# Patient Record
Sex: Female | Born: 1950
Health system: Southern US, Community
[De-identification: ages and names within clinical notes are randomized; demographics above are authoritative.]

## PROBLEM LIST (undated history)

## (undated) DIAGNOSIS — H44001 Unspecified purulent endophthalmitis, right eye: Secondary | ICD-10-CM

## (undated) DIAGNOSIS — R42 Dizziness and giddiness: Secondary | ICD-10-CM

## (undated) DIAGNOSIS — E119 Type 2 diabetes mellitus without complications: Secondary | ICD-10-CM

## (undated) DIAGNOSIS — E785 Hyperlipidemia, unspecified: Secondary | ICD-10-CM

## (undated) HISTORY — DX: Unspecified purulent endophthalmitis, right eye: H44.001

## (undated) HISTORY — PX: ECTOPIC PREGNANCY SURGERY: SHX613

## (undated) HISTORY — PX: BREAST BIOPSY: SHX20

---

## 2004-09-26 ENCOUNTER — Inpatient Hospital Stay: Payer: Self-pay | Admitting: Specialist

## 2006-07-08 HISTORY — PX: OTHER SURGICAL HISTORY: SHX169

## 2006-09-04 ENCOUNTER — Ambulatory Visit: Payer: Self-pay | Admitting: Family Medicine

## 2007-11-19 ENCOUNTER — Ambulatory Visit: Payer: Self-pay | Admitting: Family Medicine

## 2010-09-25 ENCOUNTER — Ambulatory Visit: Payer: Self-pay | Admitting: Family Medicine

## 2013-05-05 ENCOUNTER — Ambulatory Visit: Payer: Self-pay | Admitting: Obstetrics and Gynecology

## 2016-02-07 ENCOUNTER — Encounter: Payer: Self-pay | Admitting: Obstetrics and Gynecology

## 2016-02-07 ENCOUNTER — Ambulatory Visit (INDEPENDENT_AMBULATORY_CARE_PROVIDER_SITE_OTHER): Payer: BLUE CROSS/BLUE SHIELD | Admitting: Obstetrics and Gynecology

## 2016-02-07 VITALS — BP 100/60 | HR 75 | Ht 63.0 in | Wt 182.5 lb

## 2016-02-07 DIAGNOSIS — Z13220 Encounter for screening for lipoid disorders: Secondary | ICD-10-CM | POA: Diagnosis not present

## 2016-02-07 DIAGNOSIS — Z131 Encounter for screening for diabetes mellitus: Secondary | ICD-10-CM

## 2016-02-07 DIAGNOSIS — Z124 Encounter for screening for malignant neoplasm of cervix: Secondary | ICD-10-CM | POA: Diagnosis not present

## 2016-02-07 DIAGNOSIS — Z1239 Encounter for other screening for malignant neoplasm of breast: Secondary | ICD-10-CM | POA: Diagnosis not present

## 2016-02-07 DIAGNOSIS — Z1211 Encounter for screening for malignant neoplasm of colon: Secondary | ICD-10-CM

## 2016-02-07 DIAGNOSIS — Z13820 Encounter for screening for osteoporosis: Secondary | ICD-10-CM | POA: Diagnosis not present

## 2016-02-07 DIAGNOSIS — Z01419 Encounter for gynecological examination (general) (routine) without abnormal findings: Secondary | ICD-10-CM | POA: Diagnosis not present

## 2016-02-07 NOTE — Progress Notes (Signed)
ANNUAL PREVENTATIVE CARE GYNECOLOGY  ENCOUNTER NOTE  Subjective:       Wendy Tucker is a 65 y.o. G1P0010 single post-menopausal female here for a routine annual gynecologic exam. The patient is sexually active. The patient is not taking hormone replacement therapy. Patient denies post-menopausal vaginal bleeding. The patient wears seatbelts: yes. The patient participates in regular exercise: no. Has the patient ever been transfused or tattooed?: no. The patient reports that there is not domestic violence in her life.  Current complaints: 1.  None   Gynecologic History Patient's last menstrual period was 02/07/1991.  Patient is post-menopausal.  Contraception: post menopausal status Last Pap: "several years ago".  Results were: normal Last mammogram: 04/2013. Results were: normal Last Colonoscopy: has never had a colonscopy   Obstetric History OB History  Gravida Para Term Preterm AB Living  1       1    SAB TAB Ectopic Multiple Live Births      1        # Outcome Date GA Lbr Len/2nd Weight Sex Delivery Anes PTL Lv  1 Ectopic               History reviewed. No pertinent past medical history.  Family History  Problem Relation Age of Onset  . Hypertension Mother   . Stomach cancer Father   . Breast cancer Sister     Past Surgical History:  Procedure Laterality Date  . ECTOPIC PREGNANCY SURGERY      Social History   Social History  . Marital status: Single    Spouse name: N/A  . Number of children: N/A  . Years of education: N/A   Occupational History  . Not on file.   Social History Main Topics  . Smoking status: Never Smoker  . Smokeless tobacco: Never Used  . Alcohol use Yes     Comment: Occasionally   . Drug use: No  . Sexual activity: Yes    Birth control/ protection: Post-menopausal   Other Topics Concern  . Not on file   Social History Narrative  . No narrative on file    No current outpatient prescriptions on file prior to visit.   No  current facility-administered medications on file prior to visit.     No Known Allergies   Review of Systems ROS Review of Systems - General ROS: negative for - chills, fatigue, fever, hot flashes, night sweats, weight gain or weight loss Psychological ROS: negative for - anxiety, decreased libido, depression, mood swings, physical abuse or sexual abuse Ophthalmic ROS: negative for - blurry vision, eye pain or loss of vision ENT ROS: negative for - headaches, hearing change, visual changes or vocal changes Allergy and Immunology ROS: negative for - hives, itchy/watery eyes or seasonal allergies Hematological and Lymphatic ROS: negative for - bleeding problems, bruising, swollen lymph nodes or weight loss Endocrine ROS: negative for - galactorrhea, hair pattern changes, hot flashes, malaise/lethargy, mood swings, palpitations, polydipsia/polyuria, skin changes, temperature intolerance or unexpected weight changes Breast ROS: negative for - new or changing breast lumps or nipple discharge Respiratory ROS: negative for - cough or shortness of breath Cardiovascular ROS: negative for - chest pain, irregular heartbeat, palpitations or shortness of breath Gastrointestinal ROS: no abdominal pain, change in bowel habits, or black or bloody stools Genito-Urinary ROS: no dysuria, trouble voiding, or hematuria Musculoskeletal ROS: negative for - joint pain or joint stiffness Neurological ROS: negative for - bowel and bladder control changes Dermatological ROS: negative for  rash and skin lesion changes   Objective:   BP 100/60 (BP Location: Right Arm, Patient Position: Sitting, Cuff Size: Large)   Pulse 75   Ht 5\' 3"  (1.6 m)   Wt 182 lb 8 oz (82.8 kg)   LMP 02/07/1991   BMI 32.33 kg/m  CONSTITUTIONAL: Well-developed, well-nourished female in no acute distress.  PSYCHIATRIC: Normal mood and affect. Normal behavior. Normal judgment and thought content. National: Alert and oriented to person,  place, and time. Normal muscle tone coordination. No cranial nerve deficit noted. HENT:  Normocephalic, atraumatic, External right and left ear normal. Oropharynx is clear and moist EYES: Conjunctivae and EOM are normal. Pupils are equal, round, and reactive to light. No scleral icterus.  NECK: Normal range of motion, supple, no masses.  Normal thyroid.  SKIN: Skin is warm and dry. No rash noted. Not diaphoretic. No erythema. No pallor. CARDIOVASCULAR: Normal heart rate noted, regular rhythm, no murmur. RESPIRATORY: Clear to auscultation bilaterally. Effort and breath sounds normal, no problems with respiration noted. BREASTS: Symmetric in size. No masses, skin changes, nipple drainage, or lymphadenopathy. ABDOMEN: Soft, normal bowel sounds, no distention noted.  No tenderness, rebound or guarding.  BLADDER: Normal PELVIC: external genitalia normal, rectovaginal septum normal.  Vagina without discharge, mild  Atrophy present.  Cervix normal appearing, no lesions and no motion tenderness.  Uterus mobile, nontender, normal shape and size.  Adnexae non-palpable, nontender bilaterally.  RECTAL: External Exam NormaI, No Rectal Masses and Normal Sphincter tone  MUSCULOSKELETAL: Normal range of motion. No tenderness.  No cyanosis, clubbing, or edema.  2+ distal pulses. LYMPHATIC: No Axillary, Supraclavicular, or Inguinal Adenopathy.   Labs:  The patient has not had any recent blood work.   Assessment:   Annual gynecologic examination in postmenopausal female Contraception: post menopausal status Obesity (Class I)  Plan:   Pap smear performed today.  Mammogram: Ordered Stool Guaiac Testing:  Not Ordered.  Colonoscopy ordered.  Labs: CMP and CBC, Lipid 1, FBS, TSH and Hemoglobin A1C and Vit D level Routine preventative health maintenance measures emphasized: Exercise/Diet/Weight control, Alcohol/Substance use risks, Stress Management and Safe Sex Discussed healthy lifestyle modifications.    Will order for patient to have Dexa scan at age 36 (in December).  Return to [place], MD

## 2016-02-08 LAB — CBC
HEMATOCRIT: 39 % (ref 34.0–46.6)
Hemoglobin: 12.9 g/dL (ref 11.1–15.9)
MCH: 30.2 pg (ref 26.6–33.0)
MCHC: 33.1 g/dL (ref 31.5–35.7)
MCV: 91 fL (ref 79–97)
Platelets: 206 10*3/uL (ref 150–379)
RBC: 4.27 x10E6/uL (ref 3.77–5.28)
RDW: 14.7 % (ref 12.3–15.4)
WBC: 8.4 10*3/uL (ref 3.4–10.8)

## 2016-02-08 LAB — COMPREHENSIVE METABOLIC PANEL
A/G RATIO: 1.5 (ref 1.2–2.2)
ALBUMIN: 4.4 g/dL (ref 3.6–4.8)
ALT: 13 IU/L (ref 0–32)
AST: 20 IU/L (ref 0–40)
Alkaline Phosphatase: 80 IU/L (ref 39–117)
BUN / CREAT RATIO: 14 (ref 12–28)
BUN: 13 mg/dL (ref 8–27)
Bilirubin Total: 0.3 mg/dL (ref 0.0–1.2)
CALCIUM: 9.7 mg/dL (ref 8.7–10.3)
CO2: 23 mmol/L (ref 18–29)
CREATININE: 0.95 mg/dL (ref 0.57–1.00)
Chloride: 104 mmol/L (ref 96–106)
GFR, EST AFRICAN AMERICAN: 73 mL/min/{1.73_m2} (ref >59)
GFR, EST NON AFRICAN AMERICAN: 63 mL/min/{1.73_m2} (ref >59)
GLOBULIN, TOTAL: 3 g/dL (ref 1.5–4.5)
Glucose: 117 mg/dL — ABNORMAL HIGH (ref 65–99)
POTASSIUM: 4.5 mmol/L (ref 3.5–5.2)
SODIUM: 143 mmol/L (ref 134–144)
TOTAL PROTEIN: 7.4 g/dL (ref 6.0–8.5)

## 2016-02-08 LAB — LIPID PANEL
CHOLESTEROL TOTAL: 245 mg/dL — AB (ref 100–199)
Chol/HDL Ratio: 3.2 ratio units (ref 0.0–4.4)
HDL: 77 mg/dL (ref >39)
LDL Calculated: 153 mg/dL — ABNORMAL HIGH (ref 0–99)
TRIGLYCERIDES: 76 mg/dL (ref 0–149)
VLDL CHOLESTEROL CAL: 15 mg/dL (ref 5–40)

## 2016-02-08 LAB — HEMOGLOBIN A1C
Est. average glucose Bld gHb Est-mCnc: 137 mg/dL
HEMOGLOBIN A1C: 6.4 % — AB (ref 4.8–5.6)

## 2016-02-08 LAB — VITAMIN D 25 HYDROXY (VIT D DEFICIENCY, FRACTURES): Vit D, 25-Hydroxy: 27.4 ng/mL — ABNORMAL LOW (ref 30.0–100.0)

## 2016-02-08 LAB — TSH: TSH: 0.844 u[IU]/mL (ref 0.450–4.500)

## 2016-02-09 LAB — PAP IG AND HPV HIGH-RISK
HPV, HIGH-RISK: NEGATIVE
PAP SMEAR COMMENT: 0

## 2016-02-12 ENCOUNTER — Telehealth: Payer: Self-pay

## 2016-02-12 NOTE — Telephone Encounter (Signed)
Patient needs to schedule a screening colonoscopy. She is having a mammogram done and wants to wait until 2018, after her birthday, to have her colonoscopy scheduled. I am sending her a notification lett and asked her to give me a call around November when she knows the date that she'd like to schedule.

## 2016-02-20 ENCOUNTER — Telehealth: Payer: Self-pay

## 2016-02-20 NOTE — Telephone Encounter (Signed)
Called pt, no answer, LM for pt to call back

## 2016-02-20 NOTE — Telephone Encounter (Signed)
-----   Message from Rubie Maid, MD sent at 02/16/2016  9:41 AM EDT ----- Please inform patient of the following annual lab results:  1) Normal pap smear 2) Elevated cholesterol (LDL and total).  Would recommend low-cholesterol diet and exercise at least 30 min daily of moderate impact).  3) Elevated HgbA1c (6.4).  Appears as though patient may be developing diabetes.  Will need referral to a PCP for further management.  Would again encourage lifestyle and diet changes.

## 2016-02-22 NOTE — Telephone Encounter (Signed)
Called pt no answer, LM for pt to call back  

## 2016-02-27 NOTE — Telephone Encounter (Signed)
Attempted to call pt x 3. Letter mailed.

## 2016-03-12 ENCOUNTER — Ambulatory Visit: Admission: RE | Admit: 2016-03-12 | Payer: Self-pay | Source: Ambulatory Visit

## 2016-05-01 ENCOUNTER — Ambulatory Visit
Admission: RE | Admit: 2016-05-01 | Discharge: 2016-05-01 | Disposition: A | Discharge status: Home / Self Care | Payer: BLUE CROSS/BLUE SHIELD | Source: Ambulatory Visit | Attending: Obstetrics and Gynecology | Admitting: Obstetrics and Gynecology

## 2016-05-01 DIAGNOSIS — Z1231 Encounter for screening mammogram for malignant neoplasm of breast: Secondary | ICD-10-CM | POA: Diagnosis present

## 2016-05-01 DIAGNOSIS — Z1239 Encounter for other screening for malignant neoplasm of breast: Secondary | ICD-10-CM

## 2016-05-01 DIAGNOSIS — Z13820 Encounter for screening for osteoporosis: Secondary | ICD-10-CM | POA: Diagnosis present

## 2016-05-01 DIAGNOSIS — M8580 Other specified disorders of bone density and structure, unspecified site: Secondary | ICD-10-CM | POA: Diagnosis not present

## 2016-05-07 ENCOUNTER — Telehealth: Payer: Self-pay

## 2016-05-07 NOTE — Telephone Encounter (Signed)
-----   Message from Rubie Maid, MD sent at 05/06/2016 10:10 AM EDT ----- Please inform patient that her DEXA scan shows that she has osteopenia (a precursor to osteoporosis).  I would recommend that she have supplementation with Vit D (800 mIU) and at least 1000-1200 mg of Calcium daily.  In addition, I would recommend that she begin weight bearing exercises at least 3 times per week.

## 2016-05-07 NOTE — Telephone Encounter (Signed)
Called pt no answer, LM for pt to call back  

## 2016-05-08 NOTE — Telephone Encounter (Signed)
Pt called and was returning your call from yesterday

## 2016-05-08 NOTE — Telephone Encounter (Signed)
Pt called  you back.

## 2016-05-09 NOTE — Telephone Encounter (Signed)
Pt calls back informed her of results below. Pt gave verbal understanding.

## 2016-05-09 NOTE — Telephone Encounter (Signed)
Called pt no answer °

## 2016-10-21 DIAGNOSIS — H40053 Ocular hypertension, bilateral: Secondary | ICD-10-CM | POA: Diagnosis not present

## 2016-10-24 ENCOUNTER — Ambulatory Visit (INDEPENDENT_AMBULATORY_CARE_PROVIDER_SITE_OTHER): Payer: Medicare HMO | Admitting: Family Medicine

## 2016-10-24 ENCOUNTER — Encounter: Payer: Self-pay | Admitting: Family Medicine

## 2016-10-24 VITALS — BP 132/82 | HR 62 | Resp 16 | Ht 65.0 in | Wt 185.0 lb

## 2016-10-24 DIAGNOSIS — Z23 Encounter for immunization: Secondary | ICD-10-CM

## 2016-10-24 DIAGNOSIS — Z820 Family history of epilepsy and other diseases of the nervous system: Secondary | ICD-10-CM | POA: Diagnosis not present

## 2016-10-24 DIAGNOSIS — Z800 Family history of malignant neoplasm of digestive organs: Secondary | ICD-10-CM | POA: Diagnosis not present

## 2016-10-24 DIAGNOSIS — E119 Type 2 diabetes mellitus without complications: Secondary | ICD-10-CM | POA: Diagnosis not present

## 2016-10-24 DIAGNOSIS — M8580 Other specified disorders of bone density and structure, unspecified site: Secondary | ICD-10-CM | POA: Insufficient documentation

## 2016-10-24 DIAGNOSIS — Z1211 Encounter for screening for malignant neoplasm of colon: Secondary | ICD-10-CM | POA: Diagnosis not present

## 2016-10-24 DIAGNOSIS — E118 Type 2 diabetes mellitus with unspecified complications: Secondary | ICD-10-CM | POA: Insufficient documentation

## 2016-10-24 DIAGNOSIS — E669 Obesity, unspecified: Secondary | ICD-10-CM | POA: Diagnosis not present

## 2016-10-24 DIAGNOSIS — Z803 Family history of malignant neoplasm of breast: Secondary | ICD-10-CM | POA: Insufficient documentation

## 2016-10-24 DIAGNOSIS — E538 Deficiency of other specified B group vitamins: Secondary | ICD-10-CM | POA: Diagnosis not present

## 2016-10-24 DIAGNOSIS — E559 Vitamin D deficiency, unspecified: Secondary | ICD-10-CM | POA: Diagnosis not present

## 2016-10-24 NOTE — Progress Notes (Signed)
Date:  10/24/2016   Name:  Wendy Tucker   DOB:  07-Aug-1950   MRN:  [id number]  PCP:  Otilio Miu, MD    Chief Complaint: Establish Care   History of Present Illness:  This is a 66 y.o. female seen for initial visit. Saw GYN in August, blood work showed mild T2DM and vit D def, has changed diet and walking more but weight up 3#, taking vit D and B12 supplements as well as another supplement for bones. Takes asa for prevention since age 51 per GYN. Father died stomach ca age 7, mother died 34 age 62, sister with breast ca, brother with prostate ca, thyroid dz in family. Tet and pneumo status unknown, declines zoster imm, gets flu imm annually.   Review of Systems:  Review of Systems  Constitutional: Negative for chills and fever.  HENT: Negative for ear pain, sinus pain and trouble swallowing.   Eyes: Negative for pain.  Respiratory: Negative for cough and shortness of breath.   Cardiovascular: Negative for chest pain and leg swelling.  Gastrointestinal: Negative for abdominal pain, constipation and diarrhea.  Endocrine: Negative for polydipsia and polyuria.  Genitourinary: Negative for difficulty urinating.  Neurological: Negative for syncope and light-headedness.    Patient Active Problem List   Diagnosis Date Noted  . Vitamin D deficiency 10/24/2016  . Osteopenia 10/24/2016  . Diabetes mellitus type 2, controlled, without complications ([facility]) 03/50/0938  . B12 deficiency 10/24/2016  . Obesity (BMI 30.0-34.9) 10/24/2016    Prior to Admission medications   Medication Sig Start Date End Date Taking? Authorizing Provider  aspirin 81 MG tablet Take 81 mg by mouth daily.   Yes Historical Provider, MD  cholecalciferol (VITAMIN D) 1000 units tablet Take 1,000 Units by mouth daily.   Yes Historical Provider, MD  cyanocobalamin 1000 MCG tablet Take 1,000 mcg by mouth daily.   Yes Historical Provider, MD    No Known Allergies  Past Surgical History:  Procedure Laterality Date   . ECTOPIC PREGNANCY SURGERY      Social History  Substance Use Topics  . Smoking status: Never Smoker  . Smokeless tobacco: Never Used  . Alcohol use Yes     Comment: Occasionally     Family History  Problem Relation Age of Onset  . Hypertension Mother   . Stomach cancer Father   . Breast cancer Sister 37    Medication list has been reviewed and updated.  Physical Examination: BP 132/82   Pulse 62   Resp 16   Ht 5\' 5"  (1.651 m)   Wt 185 lb (83.9 kg)   LMP 02/07/1991   SpO2 98%   BMI 30.79 kg/m   Physical Exam  Constitutional: She is oriented to person, place, and time. She appears well-developed and well-nourished.  HENT:  Head: Atraumatic.  Right Ear: External ear normal.  Left Ear: External ear normal.  Nose: Nose normal.  Mouth/Throat: Oropharynx is clear and moist.  TMs clear  Eyes: Conjunctivae and EOM are normal. Pupils are equal, round, and reactive to light.  Neck: Neck supple. No thyromegaly present.  Cardiovascular: Normal rate, regular rhythm, normal heart sounds and intact distal pulses.   Pulmonary/Chest: Effort normal and breath sounds normal.  Abdominal: Soft. She exhibits no distension and no mass. There is no tenderness.  Musculoskeletal: She exhibits no edema.  Lymphadenopathy:    She has no cervical adenopathy.  Neurological: She is alert and oriented to person, place, and time. Coordination normal.  Romberg negative  gait normal  Skin: Skin is warm and dry.  Psychiatric: She has a normal mood and affect. Her behavior is normal.  Nursing note and vitals reviewed.   Assessment and Plan:  1. Controlled type 2 diabetes mellitus without complication, without long-term current use of insulin ([facility]) New onset in August (a1c 6.4%), on diet, walking more, consider diabetic education referral - HgB A1c - Urine Microalbumin w/creat. ratio  2. Osteopenia, unspecified location Per DEXA in October, cont vit D supp  3. Vitamin D deficiency On  supplement - Vitamin D (25 hydroxy)  4. B12 deficiency On supplement - B12  5. Obesity (BMI 30.0-34.9) Weight up 3# since August, exercise/weight loss discussed  6. Colon cancer screening - Ambulatory referral to Gastroenterology  7. Need for diphtheria-tetanus-pertussis (Tdap) vaccine - Tdap vaccine greater than or equal to 7yo IM  8. Need for pneumococcal vaccination Pneumovax in one year - Pneumococcal conjugate vaccine 13-valent  9. FH: Alzheimer's disease  10. FH: gastric cancer  11. FH: breast cancer Mammogram annually, last October 2017  12. HM 10 yr CVR 11%, will cont asa, consider statin next visit  Return in about 4 weeks (around 11/21/2016).   45 minutes spent with patient, over half in counseling  Satira Anis. [facility]  10/24/2016

## 2016-10-25 ENCOUNTER — Other Ambulatory Visit: Payer: Self-pay | Admitting: Family Medicine

## 2016-10-25 ENCOUNTER — Telehealth: Payer: Self-pay

## 2016-10-25 LAB — HEMOGLOBIN A1C
ESTIMATED AVERAGE GLUCOSE: 137 mg/dL
Hgb A1c MFr Bld: 6.4 % — ABNORMAL HIGH (ref 4.8–5.6)

## 2016-10-25 LAB — MICROALBUMIN / CREATININE URINE RATIO
Creatinine, Urine: 161.7 mg/dL
MICROALB/CREAT RATIO: 4 mg/g{creat} (ref 0.0–30.0)
Microalbumin, Urine: 6.5 ug/mL

## 2016-10-25 LAB — VITAMIN B12: Vitamin B-12: 1648 pg/mL — ABNORMAL HIGH (ref 232–1245)

## 2016-10-25 LAB — VITAMIN D 25 HYDROXY (VIT D DEFICIENCY, FRACTURES): VIT D 25 HYDROXY: 31.9 ng/mL (ref 30.0–100.0)

## 2016-10-25 MED ORDER — CYANOCOBALAMIN 500 MCG PO TABS: 500.0000 ug | ORAL_TABLET | Freq: Every day | ORAL | Status: DC

## 2016-10-25 NOTE — Telephone Encounter (Signed)
Taking   Vit Cod Liver Vit D3 1,000 IU [employer]

## 2016-10-28 ENCOUNTER — Other Ambulatory Visit: Payer: Self-pay | Admitting: Family Medicine

## 2016-10-28 MED ORDER — NUTRA-SUPPORT BONE PO CAPS: 1.0000 | ORAL_CAPSULE | Freq: Every day | ORAL | Status: AC

## 2016-10-28 MED ORDER — COD LIVER OIL PO CAPS: 1.0000 | ORAL_CAPSULE | Freq: Every day | ORAL | 0 refills | Status: DC

## 2016-11-01 ENCOUNTER — Telehealth: Payer: Self-pay

## 2016-11-01 ENCOUNTER — Other Ambulatory Visit: Payer: Self-pay

## 2016-11-01 DIAGNOSIS — Z1211 Encounter for screening for malignant neoplasm of colon: Secondary | ICD-10-CM

## 2016-11-01 NOTE — Telephone Encounter (Signed)
Gastroenterology Pre-Procedure Review  Request Date: Nov 21 2016 Requesting Physician: Dr. Allen Norris  PATIENT REVIEW QUESTIONS: The patient responded to the following health history questions as indicated:    1. Are you having any GI issues? no 2. Do you have a personal history of Polyps? yes (sister) 3. Do you have a family history of Colon Cancer or Polyps? yes (sister) 4. Diabetes Mellitus? no 5. Joint replacements in the past 12 months?NO- HAS ROD IN RIGHT LEG 6. Major health problems in the past 3 months?no 7. Any artificial heart valves, MVP, or defibrillator?no    MEDICATIONS & ALLERGIES:    Patient reports the following regarding taking any anticoagulation/antiplatelet therapy:   Plavix, Coumadin, Eliquis, Xarelto, Lovenox, Pradaxa, Brilinta, or Effient? no Aspirin? yes (BABY ASA 81 MG)  Patient confirms/reports the following medications:  Current Outpatient Prescriptions  Medication Sig Dispense Refill  . aspirin 81 MG tablet Take 81 mg by mouth daily.    . cholecalciferol (VITAMIN D) 1000 units tablet Take 1,000 Units by mouth daily.    Marland Kitchen Cod Liver Oil CAPS Take 1 capsule by mouth daily.  0  . cyanocobalamin 500 MCG tablet Take 1 tablet (500 mcg total) by mouth daily.    . Multiple Minerals-Vitamins (NUTRA-SUPPORT BONE) CAPS Take 1 capsule by mouth daily.     No current facility-administered medications for this visit.     Patient confirms/reports the following allergies:  No Known Allergies  No orders of the defined types were placed in this encounter.   AUTHORIZATION INFORMATION Primary Insurance: 1D#: Group #:  Secondary Insurance: 1D#: Group #:  SCHEDULE INFORMATION: Date: MAY 17TH DR WOHL Time: Location:[facility]

## 2016-11-01 NOTE — Telephone Encounter (Signed)
Opened in error

## 2016-11-06 ENCOUNTER — Telehealth: Payer: Self-pay | Admitting: Gastroenterology

## 2016-11-06 NOTE — Telephone Encounter (Signed)
11/06/16 Spoke with Terressa Koyanagi at [facility] and NO prior Josem Kaufmann is required for Colonoscopy [phone] / [id number]

## 2016-11-15 ENCOUNTER — Telehealth: Payer: Self-pay | Admitting: Gastroenterology

## 2016-11-15 NOTE — Telephone Encounter (Signed)
Patient called and lost her paperwork and prescription for her prepping for her colonoscopy. Please mail her that information. Her procedure is 5/24 [place].

## 2016-11-15 NOTE — Telephone Encounter (Signed)
Returned pts call stated she lost her colonscopy instruction sheet.  I have reprinted instructions for her and mailed.

## 2016-11-19 ENCOUNTER — Encounter: Payer: Self-pay | Admitting: *Deleted

## 2016-11-21 ENCOUNTER — Ambulatory Visit (INDEPENDENT_AMBULATORY_CARE_PROVIDER_SITE_OTHER): Payer: Medicare HMO | Admitting: Family Medicine

## 2016-11-21 ENCOUNTER — Encounter: Payer: Self-pay | Admitting: Family Medicine

## 2016-11-21 VITALS — BP 138/81 | HR 60 | Resp 16 | Ht 65.0 in | Wt 186.5 lb

## 2016-11-21 DIAGNOSIS — Z1211 Encounter for screening for malignant neoplasm of colon: Secondary | ICD-10-CM | POA: Diagnosis not present

## 2016-11-21 DIAGNOSIS — E669 Obesity, unspecified: Secondary | ICD-10-CM | POA: Diagnosis not present

## 2016-11-21 DIAGNOSIS — E785 Hyperlipidemia, unspecified: Secondary | ICD-10-CM | POA: Insufficient documentation

## 2016-11-21 DIAGNOSIS — E538 Deficiency of other specified B group vitamins: Secondary | ICD-10-CM

## 2016-11-21 DIAGNOSIS — E119 Type 2 diabetes mellitus without complications: Secondary | ICD-10-CM | POA: Diagnosis not present

## 2016-11-21 DIAGNOSIS — E559 Vitamin D deficiency, unspecified: Secondary | ICD-10-CM

## 2016-11-21 NOTE — Progress Notes (Signed)
Date:  11/21/2016   Name:  Wendy Tucker   DOB:  April 10, 1951   MRN:  [id number]  PCP:  Juline Patch, MD    Chief Complaint: Diabetes   History of Present Illness:  This is a 66 y.o. female seen for one month f/u from initial visit. Blood work confirmed T2DM with a1c 6.4%, vit D ok, MCR ok, B12 high, B12 supp dose decreased. CVR > 10%, LDL in August 153. Unsure why taking cod liver oil. Not exercising daily, understands benefit of weight loss, willing to see diabetic educator.  Review of Systems:  Review of Systems  Constitutional: Negative for chills and fever.  Respiratory: Negative for cough and shortness of breath.   Cardiovascular: Negative for chest pain, palpitations and leg swelling.  Genitourinary: Negative for difficulty urinating.  Neurological: Negative for syncope and light-headedness.    Patient Active Problem List   Diagnosis Date Noted  . Hyperlipidemia 11/21/2016  . Vitamin D deficiency 10/24/2016  . Osteopenia 10/24/2016  . Diabetes mellitus type 2, controlled, without complications ([place]) 73/41/9379  . B12 deficiency 10/24/2016  . Obesity (BMI 30.0-34.9) 10/24/2016  . FH: Alzheimer's disease 10/24/2016  . FH: gastric cancer 10/24/2016  . FH: breast cancer 10/24/2016    Prior to Admission medications   Medication Sig Start Date End Date Taking? Authorizing Provider  aspirin 81 MG tablet Take 81 mg by mouth daily.   Yes [provider]  cholecalciferol (VITAMIN D) 1000 units tablet Take 1,000 Units by mouth daily.   Yes [provider]  cyanocobalamin 500 MCG tablet Take 1 tablet (500 mcg total) by mouth daily. 10/25/16  Yes Noriko Macari, Gwyndolyn Saxon, MD  Multiple Minerals-Vitamins (NUTRA-SUPPORT BONE) CAPS Take 1 capsule by mouth daily. 10/28/16  Yes Miyo Aina, Gwyndolyn Saxon, MD    Allergies  Allergen Reactions  . Amoxicillin Rash    Past Surgical History:  Procedure Laterality Date  . ECTOPIC PREGNANCY SURGERY    . fractured leg Right 2008   has  metal rods    Social History  Substance Use Topics  . Smoking status: Never Smoker  . Smokeless tobacco: Never Used  . Alcohol use Yes     Comment: Occasionally, 2 glass wine/month    Family History  Problem Relation Age of Onset  . Hypertension Mother   . Stomach cancer Father   . Breast cancer Sister 96    Medication list has been reviewed and updated.  Physical Examination: BP 138/81   Pulse 60   Resp 16   Ht 5\' 5"  (1.651 m)   Wt 186 lb 8 oz (84.6 kg)   LMP 02/07/1991   SpO2 98%   BMI 31.04 kg/m   Physical Exam  Constitutional: She appears well-developed and well-nourished.  Cardiovascular: Normal rate and regular rhythm.   Pulmonary/Chest: Effort normal and breath sounds normal.  Musculoskeletal: She exhibits no edema.  Neurological: She is alert.  Skin: Skin is warm and dry.  Psychiatric: She has a normal mood and affect. Her behavior is normal.  Nursing note and vitals reviewed.   Assessment and Plan:  1. Controlled type 2 diabetes mellitus without complication, without long-term current use of insulin ([facility]) Well controlled on diet, prognosis discussed, recheck a1c next visit - Ambulatory referral to Nutrition and Diabetic Education  2. Hyperlipidemia, unspecified hyperlipidemia type LDL 153 in August, reluctant to start statin, will recheck lipids next visit and discuss further  3. Vitamin D deficiency Well controlled on supplement  4. B12 deficiency On decreased supplement  5. Obesity (BMI 30.0-34.9) Weight stable, exercise/weight loss discussed  6. Screening for colon cancer For colonoscopy next week  Return in about 2 months (around 01/21/2017).  Satira Anis. [facility]  11/21/2016

## 2016-11-27 NOTE — Discharge Instructions (Signed)

## 2016-11-28 ENCOUNTER — Ambulatory Visit
Admission: RE | Admit: 2016-11-28 | Discharge: 2016-11-28 | Disposition: A | Discharge status: Home / Self Care | Payer: Medicare HMO | Source: Ambulatory Visit | Attending: Gastroenterology | Admitting: Gastroenterology

## 2016-11-28 ENCOUNTER — Ambulatory Visit: Payer: Medicare HMO | Admitting: Anesthesiology

## 2016-11-28 ENCOUNTER — Encounter
Admission: RE | Disposition: A | Discharge status: Home / Self Care | Payer: Self-pay | Source: Ambulatory Visit | Attending: Gastroenterology

## 2016-11-28 DIAGNOSIS — D124 Benign neoplasm of descending colon: Secondary | ICD-10-CM

## 2016-11-28 DIAGNOSIS — K635 Polyp of colon: Secondary | ICD-10-CM

## 2016-11-28 DIAGNOSIS — K641 Second degree hemorrhoids: Secondary | ICD-10-CM | POA: Insufficient documentation

## 2016-11-28 DIAGNOSIS — E559 Vitamin D deficiency, unspecified: Secondary | ICD-10-CM | POA: Diagnosis not present

## 2016-11-28 DIAGNOSIS — Z7982 Long term (current) use of aspirin: Secondary | ICD-10-CM | POA: Diagnosis not present

## 2016-11-28 DIAGNOSIS — Z79899 Other long term (current) drug therapy: Secondary | ICD-10-CM | POA: Insufficient documentation

## 2016-11-28 DIAGNOSIS — K5730 Diverticulosis of large intestine without perforation or abscess without bleeding: Secondary | ICD-10-CM | POA: Diagnosis not present

## 2016-11-28 DIAGNOSIS — Z1211 Encounter for screening for malignant neoplasm of colon: Secondary | ICD-10-CM | POA: Diagnosis not present

## 2016-11-28 DIAGNOSIS — E119 Type 2 diabetes mellitus without complications: Secondary | ICD-10-CM | POA: Diagnosis not present

## 2016-11-28 DIAGNOSIS — D125 Benign neoplasm of sigmoid colon: Secondary | ICD-10-CM

## 2016-11-28 DIAGNOSIS — Z1239 Encounter for other screening for malignant neoplasm of breast: Secondary | ICD-10-CM

## 2016-11-28 HISTORY — DX: Dizziness and giddiness: R42

## 2016-11-28 HISTORY — PX: COLONOSCOPY WITH PROPOFOL: SHX5780

## 2016-11-28 HISTORY — PX: POLYPECTOMY: SHX5525

## 2016-11-28 SURGERY — COLONOSCOPY WITH PROPOFOL
Anesthesia: Monitor Anesthesia Care | Wound class: Contaminated

## 2016-11-28 MED ORDER — STERILE WATER FOR IRRIGATION IR SOLN
Status: DC | PRN
  Administered 2016-11-28: 10:00:00

## 2016-11-28 MED ORDER — LIDOCAINE HCL (CARDIAC) 20 MG/ML IV SOLN
INTRAVENOUS | Status: DC | PRN
  Administered 2016-11-28: 50 mg via INTRAVENOUS

## 2016-11-28 MED ORDER — LACTATED RINGERS IV SOLN
10.0000 mL/h | INTRAVENOUS | Status: DC
  Administered 2016-11-28: 10 mL/h via INTRAVENOUS

## 2016-11-28 MED ORDER — PROPOFOL 10 MG/ML IV BOLUS
INTRAVENOUS | Status: DC | PRN
  Administered 2016-11-28: 50 mg via INTRAVENOUS
  Administered 2016-11-28: 10 mg via INTRAVENOUS
  Administered 2016-11-28: 20 mg via INTRAVENOUS
  Administered 2016-11-28: 10 mg via INTRAVENOUS
  Administered 2016-11-28: 100 mg via INTRAVENOUS
  Administered 2016-11-28: 50 mg via INTRAVENOUS
  Administered 2016-11-28: 20 mg via INTRAVENOUS

## 2016-11-28 SURGICAL SUPPLY — 23 items

## 2016-11-28 NOTE — Anesthesia Procedure Notes (Signed)
Performed by: Latonyia Lopata Pre-anesthesia Checklist: Patient identified, Emergency Drugs available, Suction available, Timeout performed and Patient being monitored Patient Re-evaluated:Patient Re-evaluated prior to inductionOxygen Delivery Method: Circle system utilized Preoxygenation: Pre-oxygenation with 100% oxygen Intubation Type: Inhalational induction Ventilation: Mask ventilation without difficulty and Mask ventilation throughout procedure Dental Injury: Teeth and Oropharynx as per pre-operative assessment        

## 2016-11-28 NOTE — H&P (Signed)
   Lucilla Lame, MD [address] [place],  [address] Phone: [phone] Fax : [phone]  Primary Care Physician:  Adline Potter, MD Primary Gastroenterologist:  Dr. Allen Norris  Pre-Procedure History & Physical: HPI:  Wendy Tucker is a 66 y.o. female is here for a screening colonoscopy.   Past Medical History:  Diagnosis Date  . Eye infection, right   . Vertigo    x1, over 20 yrs ago    Past Surgical History:  Procedure Laterality Date  . ECTOPIC PREGNANCY SURGERY    . fractured leg Right 2008   has metal rods    Prior to Admission medications   Medication Sig Start Date End Date Taking? Authorizing Provider  aspirin 81 MG tablet Take 81 mg by mouth daily.   Yes [provider]  cholecalciferol (VITAMIN D) 1000 units tablet Take 1,000 Units by mouth daily.   Yes [provider]  cyanocobalamin 500 MCG tablet Take 1 tablet (500 mcg total) by mouth daily. 10/25/16  Yes Plonk, Gwyndolyn Saxon, MD  Multiple Minerals-Vitamins (NUTRA-SUPPORT BONE) CAPS Take 1 capsule by mouth daily. 10/28/16  Yes Adline Potter, MD    Allergies as of 11/01/2016  . (No Known Allergies)    Family History  Problem Relation Age of Onset  . Hypertension Mother   . Stomach cancer Father   . Breast cancer Sister 20    Social History   Social History  . Marital status: Single    Spouse name: N/A  . Number of children: N/A  . Years of education: N/A   Occupational History  . Not on file.   Social History Main Topics  . Smoking status: Never Smoker  . Smokeless tobacco: Never Used  . Alcohol use Yes     Comment: Occasionally, 2 glass wine/month  . Drug use: No  . Sexual activity: Yes    Birth control/ protection: Post-menopausal   Other Topics Concern  . Not on file   Social History Narrative  . No narrative on file    Review of Systems: See HPI, otherwise negative ROS  Physical Exam: BP 127/64   Pulse 64   Temp 98.1 F (36.7 C) (Tympanic)    Resp 16   Ht 5\' 5"  (1.651 m)   Wt 177 lb (80.3 kg)   LMP 02/07/1991   SpO2 99%   BMI 29.45 kg/m  General:   Alert,  pleasant and cooperative in NAD Head:  Normocephalic and atraumatic. Neck:  Supple; no masses or thyromegaly. Lungs:  Clear throughout to auscultation.    Heart:  Regular rate and rhythm. Abdomen:  Soft, nontender and nondistended. Normal bowel sounds, without guarding, and without rebound.   Neurologic:  Alert and  oriented x4;  grossly normal neurologically.  Impression/Plan: ERSILIA BRAWLEY is now here to undergo a screening colonoscopy.  Risks, benefits, and alternatives regarding colonoscopy have been reviewed with the patient.  Questions have been answered.  All parties agreeable.

## 2016-11-28 NOTE — Anesthesia Postprocedure Evaluation (Signed)
Anesthesia Post Note  Patient: Wendy Tucker  Procedure(s) Performed: Procedure(s) (LRB): COLONOSCOPY WITH PROPOFOL (N/A) POLYPECTOMY  Patient location during evaluation: PACU Anesthesia Type: MAC Level of consciousness: awake and alert Pain management: pain level controlled Vital Signs Assessment: post-procedure vital signs reviewed and stable Respiratory status: spontaneous breathing, nonlabored ventilation and respiratory function stable Cardiovascular status: stable Postop Assessment: no signs of nausea or vomiting Anesthetic complications: no    Veda Canning

## 2016-11-28 NOTE — Anesthesia Preprocedure Evaluation (Addendum)
Anesthesia Evaluation  Patient identified by MRN, date of birth, ID band Patient awake    Reviewed: Allergy & Precautions, H&P , NPO status   Airway Mallampati: I  TM Distance: >3 FB     Dental  (+) Teeth Intact   Pulmonary neg pulmonary ROS, neg recent URI,    breath sounds clear to auscultation       Cardiovascular negative cardio ROS   Rhythm:regular Rate:Normal     Neuro/Psych    GI/Hepatic negative GI ROS,   Endo/Other  diabetes (mild, diet controlled), Type 2vitamin D deficiency Osteopenia BMI 30  Renal/GU      Musculoskeletal   Abdominal   Peds  Hematology negative hematology ROS (+)   Anesthesia Other Findings   Reproductive/Obstetrics                           Anesthesia Physical Anesthesia Plan  ASA: II  Anesthesia Plan: MAC   Post-op Pain Management:    Induction:   Airway Management Planned:   Additional Equipment:   Intra-op Plan:   Post-operative Plan:   Informed Consent: I have reviewed the patients History and Physical, chart, labs and discussed the procedure including the risks, benefits and alternatives for the proposed anesthesia with the patient or authorized representative who has indicated his/her understanding and acceptance.     Plan Discussed with: CRNA  Anesthesia Plan Comments:        Anesthesia Quick Evaluation

## 2016-11-28 NOTE — Transfer of Care (Signed)
Immediate Anesthesia Transfer of Care Note  Patient: Wendy Tucker  Procedure(s) Performed: Procedure(s): COLONOSCOPY WITH PROPOFOL (N/A) POLYPECTOMY  Patient Location: PACU  Anesthesia Type: MAC  Level of Consciousness: awake, alert  and patient cooperative  Airway and Oxygen Therapy: Patient Spontanous Breathing and Patient connected to supplemental oxygen  Post-op Assessment: Post-op Vital signs reviewed, Patient's Cardiovascular Status Stable, Respiratory Function Stable, Patent Airway and No signs of Nausea or vomiting  Post-op Vital Signs: Reviewed and stable  Complications: No apparent anesthesia complications

## 2016-11-28 NOTE — Op Note (Signed)
[facility] Patient Name: Wendy Tucker Procedure Date: 11/28/2016 9:52 AM MRN: [id number] Account #: [id number] Date of Birth: 08-16-1950 Admit Type: Outpatient Age: 66 Room: [facility] OR ROOM 01 Gender: Female Note Status: Finalized Procedure:            Colonoscopy Indications:          Screening for colorectal malignant neoplasm Providers:            Lucilla Lame MD, MD Referring MD:         Juline Patch, MD (Referring MD) Medicines:            Propofol per Anesthesia Complications:        No immediate complications. Procedure:            Pre-Anesthesia Assessment:                       - Prior to the procedure, a History and Physical was                        performed, and patient medications and allergies were                        reviewed. The patient's tolerance of previous                        anesthesia was also reviewed. The risks and benefits of                        the procedure and the sedation options and risks were                        discussed with the patient. All questions were                        answered, and informed consent was obtained. Prior                        Anticoagulants: The patient has taken no previous                        anticoagulant or antiplatelet agents. ASA Grade                        Assessment: II - A patient with mild systemic disease.                        After reviewing the risks and benefits, the patient was                        deemed in satisfactory condition to undergo the                        procedure.                       After obtaining informed consent, the colonoscope was                        passed under direct vision. Throughout the procedure,  the patient's blood pressure, pulse, and oxygen                        saturations were monitored continuously. The [place] [id number]) was introduced through the                         anus and advanced to the the cecum, identified by                        appendiceal orifice and ileocecal valve. The                        colonoscopy was performed without difficulty. The                        patient tolerated the procedure well. The quality of                        the bowel preparation was excellent. Findings:      The perianal and digital rectal examinations were normal.      A 4 mm polyp was found in the descending colon. The polyp was sessile.       The polyp was removed with a cold snare. Resection and retrieval were       complete.      A 3 mm polyp was found in the sigmoid colon. The polyp was sessile. The       polyp was removed with a cold biopsy forceps. Resection and retrieval       were complete.      Multiple small-mouthed diverticula were found in the sigmoid colon.      Non-bleeding internal hemorrhoids were found during retroflexion. The       hemorrhoids were Grade II (internal hemorrhoids that prolapse but reduce       spontaneously). Impression:           - One 4 mm polyp in the descending colon, removed with                        a cold snare. Resected and retrieved.                       - One 3 mm polyp in the sigmoid colon, removed with a                        cold biopsy forceps. Resected and retrieved.                       - Diverticulosis in the sigmoid colon.                       - Non-bleeding internal hemorrhoids. Recommendation:       - Discharge patient to home.                       - Resume previous diet.                       - Continue present  medications.                       - Await pathology results.                       - Repeat colonoscopy in 5 years if polyp adenoma and 10                        years if hyperplastic Procedure Code(s):    --- Professional ---                       [phone], Colonoscopy, flexible; with removal of tumor(s),                        polyp(s), or other lesion(s) by  snare technique                       [id number], 46, Colonoscopy, flexible; with biopsy, single                        or multiple Diagnosis Code(s):    --- Professional ---                       Z12.11, Encounter for screening for malignant neoplasm                        of colon                       D12.4, Benign neoplasm of descending colon                       D12.5, Benign neoplasm of sigmoid colon CPT copyright 2016 American Medical Association. All rights reserved. The codes documented in this report are preliminary and upon coder review may  be revised to meet current compliance requirements. Lucilla Lame MD, MD 11/28/2016 10:10:25 AM This report has been signed electronically. Number of Addenda: 0 Note Initiated On: 11/28/2016 9:52 AM Scope Withdrawal Time: 0 hours 6 minutes 9 seconds  Total Procedure Duration: 0 hours 10 minutes 53 seconds       [facility]

## 2016-11-29 ENCOUNTER — Encounter: Payer: Self-pay | Admitting: Gastroenterology

## 2016-12-03 ENCOUNTER — Encounter: Payer: Self-pay | Admitting: Gastroenterology

## 2016-12-18 DIAGNOSIS — H40003 Preglaucoma, unspecified, bilateral: Secondary | ICD-10-CM | POA: Diagnosis not present

## 2017-01-02 ENCOUNTER — Encounter: Payer: Medicare HMO | Attending: Family Medicine | Admitting: Dietician

## 2017-01-02 DIAGNOSIS — Z713 Dietary counseling and surveillance: Secondary | ICD-10-CM | POA: Insufficient documentation

## 2017-01-02 DIAGNOSIS — E119 Type 2 diabetes mellitus without complications: Secondary | ICD-10-CM | POA: Diagnosis not present

## 2017-01-02 NOTE — Progress Notes (Signed)
Medical Nutrition Therapy: Visit start time: 8:45 am  end time: 9:50 Assessment:  Diagnosis: Type 2 diabetes Past medical history: Vitamin D and B12 deficency  Psychosocial issues/ stress concerns: none identified Preferred learning method:  Nicki [place] . Hands-on  Current weight: 180 lbs (reported by patient)  Height: 65 in Medications, supplements: see list Progress and evaluation:  Patient in for initial medical nutrition therapy appointment. She reports that she requested a referral since her glucose was just at the borderline for Diabetes. Her HgA1c 2 months ago was 6.4. She has been making some positive diet changes such as eating more chicken and fish and less red meat as well as increasing vegetable intake. She continues to include sweetened beverages daily including fruit juice, tea and sugar in her coffee. She has decreased her intake of "sweets" and may include a dessert/sweet snack 4-5 x/week; typically ice cream (1 1/2 cup serving). Her present diet is low in whole grains and calcium sources.  Physical activity:walking for exercise 3 days/week for 1 hour  Dietary Intake:  Usual eating pattern includes 1 large meal meals and 2 snack type meals per day. Dining out frequency: rarely  Breakfast: 5:00 coffee with cream/sugar; hash browns; sometimes an egg Lunch: 12:00- blueberry fruit bar, 10 oz fruit juice, water Supper: 5-6:0pm-Ex. fried fish, corn, black-eyed peas, corn. Tea or cranberry juice, water Snack: 1.5 cups of ice cream- several times per week Beverages: water, coffee, fruit juice, tea  Nutrition Care Education: Diabetes:  Instructed on a meal plan based on 1500 calories including carbohydrate counting and ways to better balance carbohydrate, protein, fat and non-starchy vegetables. Used food guide plate for diabetes, food models and "Planning a Balanced Meal" to show examples of meals based on her food preferences and foods available. Encouraged to use guides to  increase mindfulness of foods and portions verses being over restrictive. Instructed on reading food labels. Discussed importance of regular exercise in decreasing insulin resistance.  Nutritional Diagnosis:  NI-5.8.4 Inconsistent carbohydrate intake As related to low intake at breakfast and lunch snack type meal and large intake at dinner as well as intake of sweetened beverages.  As evidenced by diet history..  Intervention:  Balance meals with protein (1-4 oz), 2-3 servings of carbohydrate (starch, fruit, milk, yogurt,) and free vegetables. Add as many free vegetables as possible. Decrease juices and other sweetened beverages with goal of eliminating. Count 1 oz of chips as a serving of carbohydrate (about 1 handful) Include only 1 serving of fruit per meal but can have several servings per day. Exercise goal: 5 days per week for at least 30 minutes.  Education Materials given:  . Plate Planner . Food lists/ Planning A Balanced Meal . Sample meal pattern/ menus . Goals/ instructions Learner/ who was taught:  . Patient  Level of understanding: . Unable to understand/ needs instruction . Partial understanding; needs review/ practice . Verbalizes/ demonstrates competency . Not applicable Demonstrated degree of understanding via:   Teach back Learning barriers: . None Willingness to learn/ readiness for change: . Eager, change in progress  Monitoring and Evaluation: no follow-up scheduled at this time. Patient was encouraged to call if desires further help with her diet/nutrition.

## 2017-01-02 NOTE — Patient Instructions (Addendum)
Balance meals with protein (1-4 oz), 2-3 servings of carbohydrate (starch, fruit, milk, yogurt,) and free vegetables. Add as many free vegetables as possible. Decrease juices and other sweetened beverages with goal of eliminating. Count 1 oz of chips as a serving of carbohydrate (about 1 handful) Include only 1 serving of fruit per meal but can have several servings per day. Exercise goal: 5 days per week for at least 30 minutes.

## 2017-01-16 ENCOUNTER — Telehealth: Payer: Self-pay | Admitting: Obstetrics and Gynecology

## 2017-01-16 NOTE — Telephone Encounter (Signed)
I spoke with the patient in regartds to he appointment on 02/13/2017. Patient canceled appointment and stated that she will call back and reschedule the appointment at her earliest convenience. Thank you.

## 2017-01-21 ENCOUNTER — Ambulatory Visit: Payer: Medicare HMO | Admitting: Family Medicine

## 2017-01-28 ENCOUNTER — Ambulatory Visit: Payer: Medicare HMO | Admitting: Family Medicine

## 2017-01-29 ENCOUNTER — Encounter: Payer: Self-pay | Admitting: Family Medicine

## 2017-01-29 ENCOUNTER — Ambulatory Visit (INDEPENDENT_AMBULATORY_CARE_PROVIDER_SITE_OTHER): Payer: Medicare HMO | Admitting: Family Medicine

## 2017-01-29 VITALS — BP 120/76 | HR 68 | Resp 16 | Ht 65.0 in | Wt 171.0 lb

## 2017-01-29 DIAGNOSIS — E669 Obesity, unspecified: Secondary | ICD-10-CM

## 2017-01-29 DIAGNOSIS — E559 Vitamin D deficiency, unspecified: Secondary | ICD-10-CM | POA: Diagnosis not present

## 2017-01-29 DIAGNOSIS — E119 Type 2 diabetes mellitus without complications: Secondary | ICD-10-CM | POA: Diagnosis not present

## 2017-01-29 DIAGNOSIS — E538 Deficiency of other specified B group vitamins: Secondary | ICD-10-CM

## 2017-01-29 DIAGNOSIS — E66811 Obesity, class 1: Secondary | ICD-10-CM

## 2017-01-29 DIAGNOSIS — E785 Hyperlipidemia, unspecified: Secondary | ICD-10-CM

## 2017-01-29 NOTE — Progress Notes (Signed)
Date:  01/29/2017   Name:  Wendy Tucker   DOB:  1950-08-06   MRN:  [id number]  PCP:  Adline Potter, MD    Chief Complaint: Diabetes (Lost 6 pounds but wonders is she causing diabetes from her stress level of dealing with sick sister. )   History of Present Illness:  This is a 66 y.o. female seen for two month f/u, initial visit 3 months ago. T2DM diet controlled, has lost 6#, avoiding sweets. HLD reluctant to start statin.Taking vit D and B12 supps.  Review of Systems:  Review of Systems  Constitutional: Negative for chills and fever.  Respiratory: Negative for cough and shortness of breath.   Cardiovascular: Negative for chest pain and leg swelling.  Endocrine: Negative for polydipsia and polyuria.  Genitourinary: Negative for difficulty urinating.  Neurological: Negative for syncope and light-headedness.    Patient Active Problem List   Diagnosis Date Noted  . Special screening for malignant neoplasms, colon   . Polyp of sigmoid colon   . Benign neoplasm of descending colon   . Hyperlipidemia 11/21/2016  . Vitamin D deficiency 10/24/2016  . Osteopenia 10/24/2016  . Diabetes mellitus type 2, controlled, without complications ([place]) 09/62/8366  . B12 deficiency 10/24/2016  . Obesity (BMI 30.0-34.9) 10/24/2016  . FH: Alzheimer's disease 10/24/2016  . FH: gastric cancer 10/24/2016  . FH: breast cancer 10/24/2016    Prior to Admission medications   Medication Sig Start Date End Date Taking? Authorizing Provider  aspirin 81 MG tablet Take 81 mg by mouth daily.   Yes [provider]  cholecalciferol (VITAMIN D) 1000 units tablet Take 1,000 Units by mouth daily.   Yes [provider]  cyanocobalamin 500 MCG tablet Take 1 tablet (500 mcg total) by mouth daily. 10/25/16  Yes Doil Kamara, Gwyndolyn Saxon, MD  Multiple Minerals-Vitamins (NUTRA-SUPPORT BONE) CAPS Take 1 capsule by mouth daily. 10/28/16  Yes Buffie Herne, Gwyndolyn Saxon, MD    Allergies  Allergen Reactions  . Amoxicillin  Rash    Past Surgical History:  Procedure Laterality Date  . COLONOSCOPY WITH PROPOFOL N/A 11/28/2016   Procedure: COLONOSCOPY WITH PROPOFOL;  Surgeon: Lucilla Lame, MD;  Location: [place];  Service: Endoscopy;  Laterality: N/A;  . ECTOPIC PREGNANCY SURGERY    . fractured leg Right 2008   has metal rods  . POLYPECTOMY  11/28/2016   Procedure: POLYPECTOMY;  Surgeon: Lucilla Lame, MD;  Location: [place];  Service: Endoscopy;;    Social History  Substance Use Topics  . Smoking status: Never Smoker  . Smokeless tobacco: Never Used  . Alcohol use Yes     Comment: Occasionally, 2 glass wine/month    Family History  Problem Relation Age of Onset  . Hypertension Mother   . Stomach cancer Father   . Breast cancer Sister 30    Medication list has been reviewed and updated.  Physical Examination: BP 120/76   Pulse 68   Resp 16   Ht 5\' 5"  (1.651 m)   Wt 171 lb (77.6 kg)   LMP 02/07/1991   SpO2 98%   BMI 28.46 kg/m   Physical Exam  Constitutional: She appears well-developed and well-nourished.  Cardiovascular: Normal rate, regular rhythm and normal heart sounds.   Pulmonary/Chest: Effort normal and breath sounds normal.  Musculoskeletal: She exhibits no edema.  Neurological: She is alert.  Skin: Skin is warm and dry.  Psychiatric: She has a normal mood and affect. Her behavior is normal.  Nursing note and vitals reviewed.  Assessment and Plan:  1. Controlled type 2 diabetes mellitus without complication, without long-term current use of insulin (HCC) Diet controlled, cont NAS diet - HgB A1c  2. Vitamin D deficiency Well controlled on supplement  3. B12 deficiency On supplement - B12  4. Hyperlipidemia, unspecified hyperlipidemia type Reluctant to start statin - Lipid Profile  5. Obesity (BMI 30.0-34.9) Weight down 15# per our scale, cont exercise/weight loss  Return in about 6 months (around 08/01/2017).  Satira Anis. [facility]  01/29/2017

## 2017-01-31 LAB — LIPID PANEL
CHOL/HDL RATIO: 3.1 ratio (ref 0.0–4.4)
Cholesterol, Total: 228 mg/dL — ABNORMAL HIGH (ref 100–199)
HDL: 74 mg/dL (ref >39)
LDL CALC: 136 mg/dL — AB (ref 0–99)
TRIGLYCERIDES: 90 mg/dL (ref 0–149)
VLDL CHOLESTEROL CAL: 18 mg/dL (ref 5–40)

## 2017-01-31 LAB — HEMOGLOBIN A1C
Est. average glucose Bld gHb Est-mCnc: 131 mg/dL
HEMOGLOBIN A1C: 6.2 % — AB (ref 4.8–5.6)

## 2017-01-31 LAB — VITAMIN B12: Vitamin B-12: 913 pg/mL (ref 232–1245)

## 2017-02-13 ENCOUNTER — Encounter: Payer: BLUE CROSS/BLUE SHIELD | Admitting: Obstetrics and Gynecology

## 2017-02-28 ENCOUNTER — Encounter: Payer: Self-pay | Admitting: Obstetrics and Gynecology

## 2017-02-28 ENCOUNTER — Ambulatory Visit (INDEPENDENT_AMBULATORY_CARE_PROVIDER_SITE_OTHER): Payer: Medicare HMO | Admitting: Obstetrics and Gynecology

## 2017-02-28 VITALS — BP 109/53 | HR 64 | Ht 64.0 in | Wt 176.1 lb

## 2017-02-28 DIAGNOSIS — Z1231 Encounter for screening mammogram for malignant neoplasm of breast: Secondary | ICD-10-CM

## 2017-02-28 DIAGNOSIS — E785 Hyperlipidemia, unspecified: Secondary | ICD-10-CM | POA: Diagnosis not present

## 2017-02-28 DIAGNOSIS — Z803 Family history of malignant neoplasm of breast: Secondary | ICD-10-CM | POA: Diagnosis not present

## 2017-02-28 DIAGNOSIS — E119 Type 2 diabetes mellitus without complications: Secondary | ICD-10-CM | POA: Diagnosis not present

## 2017-02-28 DIAGNOSIS — Z01419 Encounter for gynecological examination (general) (routine) without abnormal findings: Secondary | ICD-10-CM

## 2017-02-28 DIAGNOSIS — M85859 Other specified disorders of bone density and structure, unspecified thigh: Secondary | ICD-10-CM

## 2017-02-28 DIAGNOSIS — E669 Obesity, unspecified: Secondary | ICD-10-CM | POA: Diagnosis not present

## 2017-02-28 NOTE — Progress Notes (Signed)
ANNUAL PREVENTATIVE CARE GYNECOLOGY  ENCOUNTER NOTE  Subjective:       Wendy Tucker is a 66 y.o. G1P0010 single post-menopausal female here for a routine annual gynecologic exam. The patient is sexually active. The patient is not taking hormone replacement therapy. Patient denies post-menopausal vaginal bleeding. The patient wears seatbelts: yes. The patient participates in regular exercise: no. Has the patient ever been transfused or tattooed?: no. The patient reports that there is not domestic violence in her life.  Current complaints: 1.  None   Gynecologic History Patient's last menstrual period was 02/07/1991.  Patient is post-menopausal.  Contraception: post menopausal status Last Pap: 02/2016.  Results were: normal Last mammogram: 04/2016. Results were: normal Last Colonoscopy: 11/28/2016.  Results were: Benign polyps, diverticulosis, and internal hemorrhoids. Can repeat in 10 years.  Last Dexa Scan: 04/2016.  Results were osteopenia (T score -1.4).   Obstetric History OB History  Gravida Para Term Preterm AB Living  1       1    SAB TAB Ectopic Multiple Live Births      1        # Outcome Date GA Lbr Len/2nd Weight Sex Delivery Anes PTL Lv  1 Ectopic               Past Medical History:  Diagnosis Date  . Eye infection, right   . Vertigo    x1, over 20 yrs ago    Family History  Problem Relation Age of Onset  . Hypertension Mother   . Stomach cancer Father   . Breast cancer Sister 76  . Bone cancer Sister     Past Surgical History:  Procedure Laterality Date  . COLONOSCOPY WITH PROPOFOL N/A 11/28/2016   Procedure: COLONOSCOPY WITH PROPOFOL;  Surgeon: Lucilla Lame, MD;  Location: [place];  Service: Endoscopy;  Laterality: N/A;  . ECTOPIC PREGNANCY SURGERY    . fractured leg Right 2008   has metal rods  . POLYPECTOMY  11/28/2016   Procedure: POLYPECTOMY;  Surgeon: Lucilla Lame, MD;  Location: [place];  Service: Endoscopy;;     Social History   Social History  . Marital status: Single    Spouse name: N/A  . Number of children: N/A  . Years of education: N/A   Occupational History  . Not on file.   Social History Main Topics  . Smoking status: Never Smoker  . Smokeless tobacco: Never Used  . Alcohol use Yes     Comment: Occasionally, 2 glass wine/month  . Drug use: No  . Sexual activity: Yes    Birth control/ protection: Post-menopausal   Other Topics Concern  . Not on file   Social History Narrative  . No narrative on file    Current Outpatient Prescriptions on File Prior to Visit  Medication Sig Dispense Refill  . aspirin 81 MG tablet Take 81 mg by mouth daily.    . cholecalciferol (VITAMIN D) 1000 units tablet Take 1,000 Units by mouth daily.    . cyanocobalamin 500 MCG tablet Take 1 tablet (500 mcg total) by mouth daily.    . Multiple Minerals-Vitamins (NUTRA-SUPPORT BONE) CAPS Take 1 capsule by mouth daily.     No current facility-administered medications on file prior to visit.     Allergies  Allergen Reactions  . Amoxicillin Rash     Review of Systems ROS General ROS:-  negative for - chills, fatigue, fever, hot flashes, night sweats, weight gain.  Positive for  intentional weight loss (16 lbs) Psychological ROS: negative for - anxiety, decreased libido, depression, mood swings, physical abuse or sexual abuse Ophthalmic ROS: negative for - blurry vision, eye pain or loss of vision ENT ROS: negative for - headaches, hearing change, visual changes or vocal changes Allergy and Immunology ROS: negative for - hives, itchy/watery eyes or seasonal allergies Hematological and Lymphatic ROS: negative for - bleeding problems, bruising, swollen lymph nodes or weight loss Endocrine ROS: negative for - galactorrhea, hair pattern changes, hot flashes, malaise/lethargy, mood swings, palpitations, polydipsia/polyuria, skin changes, temperature intolerance or unexpected weight changes Breast  ROS: negative for - new or changing breast lumps or nipple discharge Respiratory ROS: negative for - cough or shortness of breath Cardiovascular ROS: negative for - chest pain, irregular heartbeat, palpitations or shortness of breath Gastrointestinal ROS: no abdominal pain, change in bowel habits, or black or bloody stools Genito-Urinary ROS: no dysuria, trouble voiding, or hematuria Musculoskeletal ROS: negative for - joint pain or joint stiffness Neurological ROS: negative for - bowel and bladder control changes Dermatological ROS: negative for rash and skin lesion changes   Objective:   BP (!) 109/53 (BP Location: Left Arm, Patient Position: Sitting, Cuff Size: Normal)   Pulse 64   Ht 5\' 4"  (1.626 m)   Wt 176 lb 1.6 oz (79.9 kg)   LMP 02/07/1991   BMI 30.23 kg/m  CONSTITUTIONAL: Well-developed, well-nourished female in no acute distress. Mild obesity PSYCHIATRIC: Normal mood and affect. Normal behavior. Normal judgment and thought content. [place]: Alert and oriented to person, place, and time. Normal muscle tone coordination. No cranial nerve deficit noted. HENT:  Normocephalic, atraumatic, External right and left ear normal. Oropharynx is clear and moist EYES: Conjunctivae and EOM are normal. Pupils are equal, round, and reactive to light. No scleral icterus.  NECK: Normal range of motion, supple, no masses.  Normal thyroid.  SKIN: Skin is warm and dry. No rash noted. Not diaphoretic. No erythema. No pallor. CARDIOVASCULAR: Normal heart rate noted, regular rhythm, no murmur. RESPIRATORY: Clear to auscultation bilaterally. Effort and breath sounds normal, no problems with respiration noted. BREASTS: Symmetric in size. No masses, skin changes, nipple drainage, or lymphadenopathy. ABDOMEN: Soft, normal bowel sounds, no distention noted.  No tenderness, rebound or guarding.  BLADDER: Normal PELVIC: external genitalia normal, rectovaginal septum normal.  Vagina without discharge,  atrophy present.  Cervix normal appearing, no lesions and no motion tenderness.  Uterus mobile, nontender, normal shape and size.  Adnexae non-palpable, nontender bilaterally.  RECTAL: External Exam NormaI, No Rectal Masses and Normal Sphincter tone  MUSCULOSKELETAL: Normal range of motion. No tenderness.  No cyanosis, clubbing, or edema.  2+ distal pulses. LYMPHATIC: No Axillary, Supraclavicular, or Inguinal Adenopathy.   Labs:  . Lab Results  Component Value Date   WBC 8.4 02/07/2016   HGB 12.9 02/07/2016   HCT 39.0 02/07/2016   MCV 91 02/07/2016   PLT 206 02/07/2016    Lab Results  Component Value Date   CREATININE 0.95 02/07/2016   BUN 13 02/07/2016   NA 143 02/07/2016   K 4.5 02/07/2016   CL 104 02/07/2016   CO2 23 02/07/2016    Lab Results  Component Value Date   ALT 13 02/07/2016   AST 20 02/07/2016   ALKPHOS 80 02/07/2016   BILITOT 0.3 02/07/2016    Lab Results  Component Value Date   CHOL 228 (H) 01/29/2017   CHOL 245 (H) 02/07/2016   Lab Results  Component Value Date   HDL  74 01/29/2017   HDL 77 02/07/2016   Lab Results  Component Value Date   LDLCALC 136 (H) 01/29/2017   LDLCALC 153 (H) 02/07/2016   Lab Results  Component Value Date   TRIG 90 01/29/2017   TRIG 76 02/07/2016   Lab Results  Component Value Date   CHOLHDL 3.1 01/29/2017   CHOLHDL 3.2 02/07/2016   No results found for: LDLDIRECT   Lab Results  Component Value Date   HGBA1C 6.2 (H) 01/29/2017    Lab Results  Component Value Date   TSH 0.844 02/07/2016     Assessment:   Encounter for well woman exam with routine gynecological exam   Encounter for screening mammogram for breast cancer   Family history of breast cancer in sister   Osteopenia of neck of femur, unspecified laterality  Mild obesity  Dyslipidemia  Type 2 diabetes mellitus without complication, without long-term current use of insulin ([place])   Plan:   Pap smear up to date.  No longer requires  further cervical cancer screening based on age.  Mammogram: Ordered Stool Guaiac Testing: Colonoscopy up to date.  Recommend repeat in 10 years.  Labs: None.  Labs up to date.  Patient also has f/u with PCP in 6 months.  Routine preventative health maintenance measures emphasized: Exercise/Diet/Weight control, Alcohol/Substance use risks and Stress Management.  Dyslipidemia and Type II DM managed by PCP.  Discussed healthy lifestyle modifications.  Osteopenia: Counseled on Calcium and Vitamin D supplementation.  Return to [place], MD

## 2017-02-28 NOTE — Patient Instructions (Addendum)
Health Maintenance for Postmenopausal Women Menopause is a normal process in which your reproductive ability comes to an end. This process happens gradually over a span of months to years, usually between the ages of 22 and 9. Menopause is complete when you have missed 12 consecutive menstrual periods. It is important to talk with your health care provider about some of the most common conditions that affect postmenopausal women, such as heart disease, cancer, and bone loss (osteoporosis). Adopting a healthy lifestyle and getting preventive care can help to promote your health and wellness. Those actions can also lower your chances of developing some of these common conditions. What should I know about menopause? During menopause, you may experience a number of symptoms, such as:  Moderate-to-severe hot flashes.  Night sweats.  Decrease in sex drive.  Mood swings.  Headaches.  Tiredness.  Irritability.  Memory problems.  Insomnia.  Choosing to treat or not to treat menopausal changes is an individual decision that you make with your health care provider. What should I know about hormone replacement therapy and supplements? Hormone therapy products are effective for treating symptoms that are associated with menopause, such as hot flashes and night sweats. Hormone replacement carries certain risks, especially as you become older. If you are thinking about using estrogen or estrogen with progestin treatments, discuss the benefits and risks with your health care provider. What should I know about heart disease and stroke? Heart disease, heart attack, and stroke become more likely as you age. This may be due, in part, to the hormonal changes that your body experiences during menopause. These can affect how your body processes dietary fats, triglycerides, and cholesterol. Heart attack and stroke are both medical emergencies. There are many things that you can do to help prevent heart disease  and stroke:  Have your blood pressure checked at least every 1-2 years. High blood pressure causes heart disease and increases the risk of stroke.  If you are 53-22 years old, ask your health care provider if you should take aspirin to prevent a heart attack or a stroke.  Do not use any tobacco products, including cigarettes, chewing tobacco, or electronic cigarettes. If you need help quitting, ask your health care provider.  It is important to eat a healthy diet and maintain a healthy weight. ? Be sure to include plenty of vegetables, fruits, low-fat dairy products, and lean protein. ? Avoid eating foods that are high in solid fats, added sugars, or salt (sodium).  Get regular exercise. This is one of the most important things that you can do for your health. ? Try to exercise for at least 150 minutes each week. The type of exercise that you do should increase your heart rate and make you sweat. This is known as moderate-intensity exercise. ? Try to do strengthening exercises at least twice each week. Do these in addition to the moderate-intensity exercise.  Know your numbers.Ask your health care provider to check your cholesterol and your blood glucose. Continue to have your blood tested as directed by your health care provider.  What should I know about cancer screening? There are several types of cancer. Take the following steps to reduce your risk and to catch any cancer development as early as possible. Breast Cancer  Practice breast self-awareness. ? This means understanding how your breasts normally appear and feel. ? It also means doing regular breast self-exams. Let your health care provider know about any changes, no matter how small.  If you are 40  or older, have a clinician do a breast exam (clinical breast exam or CBE) every year. Depending on your age, family history, and medical history, it may be recommended that you also have a yearly breast X-ray (mammogram).  If you  have a family history of breast cancer, talk with your health care provider about genetic screening.  If you are at high risk for breast cancer, talk with your health care provider about having an MRI and a mammogram every year.  Breast cancer (BRCA) gene test is recommended for women who have family members with BRCA-related cancers. Results of the assessment will determine the need for genetic counseling and BRCA1 and for BRCA2 testing. BRCA-related cancers include these types: ? Breast. This occurs in males or females. ? Ovarian. ? Tubal. This may also be called fallopian tube cancer. ? Cancer of the abdominal or pelvic lining (peritoneal cancer). ? Prostate. ? Pancreatic.  Cervical, Uterine, and Ovarian Cancer Your health care provider may recommend that you be screened regularly for cancer of the pelvic organs. These include your ovaries, uterus, and vagina. This screening involves a pelvic exam, which includes checking for microscopic changes to the surface of your cervix (Pap test).  For women ages 21-65, health care providers may recommend a pelvic exam and a Pap test every three years. For women ages 79-65, they may recommend the Pap test and pelvic exam, combined with testing for human papilloma virus (HPV), every five years. Some types of HPV increase your risk of cervical cancer. Testing for HPV may also be done on women of any age who have unclear Pap test results.  Other health care providers may not recommend any screening for nonpregnant women who are considered low risk for pelvic cancer and have no symptoms. Ask your health care provider if a screening pelvic exam is right for you.  If you have had past treatment for cervical cancer or a condition that could lead to cancer, you need Pap tests and screening for cancer for at least 20 years after your treatment. If Pap tests have been discontinued for you, your risk factors (such as having a new sexual partner) need to be  reassessed to determine if you should start having screenings again. Some women have medical problems that increase the chance of getting cervical cancer. In these cases, your health care provider may recommend that you have screening and Pap tests more often.  If you have a family history of uterine cancer or ovarian cancer, talk with your health care provider about genetic screening.  If you have vaginal bleeding after reaching menopause, tell your health care provider.  There are currently no reliable tests available to screen for ovarian cancer.  Lung Cancer Lung cancer screening is recommended for adults 69-62 years old who are at high risk for lung cancer because of a history of smoking. A yearly low-dose CT scan of the lungs is recommended if you:  Currently smoke.  Have a history of at least 30 pack-years of smoking and you currently smoke or have quit within the past 15 years. A pack-year is smoking an average of one pack of cigarettes per day for one year.  Yearly screening should:  Continue until it has been 15 years since you quit.  Stop if you develop a health problem that would prevent you from having lung cancer treatment.  Colorectal Cancer  This type of cancer can be detected and can often be prevented.  Routine colorectal cancer screening usually begins at  age 42 and continues through age 45.  If you have risk factors for colon cancer, your health care provider may recommend that you be screened at an earlier age.  If you have a family history of colorectal cancer, talk with your health care provider about genetic screening.  Your health care provider may also recommend using home test kits to check for hidden blood in your stool.  A small camera at the end of a tube can be used to examine your colon directly (sigmoidoscopy or colonoscopy). This is done to check for the earliest forms of colorectal cancer.  Direct examination of the colon should be repeated every  5-10 years until age 71. However, if early forms of precancerous polyps or small growths are found or if you have a family history or genetic risk for colorectal cancer, you may need to be screened more often.  Skin Cancer  Check your skin from head to toe regularly.  Monitor any moles. Be sure to tell your health care provider: ? About any new moles or changes in moles, especially if there is a change in a mole's shape or color. ? If you have a mole that is larger than the size of a pencil eraser.  If any of your family members has a history of skin cancer, especially at a young age, talk with your health care provider about genetic screening.  Always use sunscreen. Apply sunscreen liberally and repeatedly throughout the day.  Whenever you are outside, protect yourself by wearing long sleeves, pants, a wide-brimmed hat, and sunglasses.  What should I know about osteoporosis? Osteoporosis is a condition in which bone destruction happens more quickly than new bone creation. After menopause, you may be at an increased risk for osteoporosis. To help prevent osteoporosis or the bone fractures that can happen because of osteoporosis, the following is recommended:  If you are 46-71 years old, get at least 1,000 mg of calcium and at least 600 mg of vitamin D per day.  If you are older than age 55 but younger than age 65, get at least 1,200 mg of calcium and at least 600 mg of vitamin D per day.  If you are older than age 54, get at least 1,200 mg of calcium and at least 800 mg of vitamin D per day.  Smoking and excessive alcohol intake increase the risk of osteoporosis. Eat foods that are rich in calcium and vitamin D, and do weight-bearing exercises several times each week as directed by your health care provider. What should I know about how menopause affects my mental health? Depression may occur at any age, but it is more common as you become older. Common symptoms of depression  include:  Low or sad mood.  Changes in sleep patterns.  Changes in appetite or eating patterns.  Feeling an overall lack of motivation or enjoyment of activities that you previously enjoyed.  Frequent crying spells.  Talk with your health care provider if you think that you are experiencing depression. What should I know about immunizations? It is important that you get and maintain your immunizations. These include:  Tetanus, diphtheria, and pertussis (Tdap) booster vaccine.  Influenza every year before the flu season begins.  Pneumonia vaccine.  Shingles vaccine.  Your health care provider may also recommend other immunizations. This information is not intended to replace advice given to you by your health care provider. Make sure you discuss any questions you have with your health care provider. Document Released: 08/16/2005  Document Revised: 01/12/2016 Document Reviewed: 03/28/2015 Elsevier Interactive Patient Education  2018 [employer]  

## 2017-02-28 NOTE — Progress Notes (Deleted)
ANNUAL PREVENTATIVE CARE GYNECOLOGY  ENCOUNTER NOTE  Subjective:       Wendy Tucker is a 66 y.o. G64P0010 female here for a routine annual gynecologic exam. The patient {is/is not/has never been:13135} sexually active. The patient {is/is not:13135} taking hormone replacement therapy. {post-men bleed:13152::"Patient denies post-menopausal vaginal bleeding."} The patient wears seatbelts: {yes/no:311178}. The patient participates in regular exercise: {yes/no/not asked:9010}. Has the patient ever been transfused or tattooed?: {yes/no/not asked:9010}. The patient reports that there {is/is not:9024} domestic violence in her life.  Current complaints: 1.  ***    Gynecologic History Patient's last menstrual period was 02/07/1991. Contraception: {method:5051} Last Pap: ***. Results were: {norm/abn:16337} Last mammogram: ***. Results were: {norm/abn:16337} Last Colonoscopy:  Last Dexa Scan:    Obstetric History OB History  Gravida Para Term Preterm AB Living  1       1    SAB TAB Ectopic Multiple Live Births      1        # Outcome Date GA Lbr Len/2nd Weight Sex Delivery Anes PTL Lv  1 Ectopic               Past Medical History:  Diagnosis Date  . Eye infection, right   . Vertigo    x1, over 20 yrs ago    Family History  Problem Relation Age of Onset  . Hypertension Mother   . Stomach cancer Father   . Breast cancer Sister 27  . Bone cancer Sister     Past Surgical History:  Procedure Laterality Date  . COLONOSCOPY WITH PROPOFOL N/A 11/28/2016   Procedure: COLONOSCOPY WITH PROPOFOL;  Surgeon: Lucilla Lame, MD;  Location: [facility];  Service: Endoscopy;  Laterality: N/A;  . ECTOPIC PREGNANCY SURGERY    . fractured leg Right 2008   has metal rods  . POLYPECTOMY  11/28/2016   Procedure: POLYPECTOMY;  Surgeon: Lucilla Lame, MD;  Location: [facility];  Service: Endoscopy;;    Social History   Social History  . Marital status: Single    Spouse name:  N/A  . Number of children: N/A  . Years of education: N/A   Occupational History  . Not on file.   Social History Main Topics  . Smoking status: Never Smoker  . Smokeless tobacco: Never Used  . Alcohol use Yes     Comment: Occasionally, 2 glass wine/month  . Drug use: No  . Sexual activity: Yes    Birth control/ protection: Post-menopausal   Other Topics Concern  . Not on file   Social History Narrative  . No narrative on file    Current Outpatient Prescriptions on File Prior to Visit  Medication Sig Dispense Refill  . aspirin 81 MG tablet Take 81 mg by mouth daily.    . cholecalciferol (VITAMIN D) 1000 units tablet Take 1,000 Units by mouth daily.    . cyanocobalamin 500 MCG tablet Take 1 tablet (500 mcg total) by mouth daily.    . Multiple Minerals-Vitamins (NUTRA-SUPPORT BONE) CAPS Take 1 capsule by mouth daily.     No current facility-administered medications on file prior to visit.     Allergies  Allergen Reactions  . Amoxicillin Rash      Review of Systems ROS Review of Systems - General ROS: negative for - chills, fatigue, fever, hot flashes, night sweats, weight gain or weight loss Psychological ROS: negative for - anxiety, decreased libido, depression, mood swings, physical abuse or sexual abuse Ophthalmic ROS: negative for -  blurry vision, eye pain or loss of vision ENT ROS: negative for - headaches, hearing change, visual changes or vocal changes Allergy and Immunology ROS: negative for - hives, itchy/watery eyes or seasonal allergies Hematological and Lymphatic ROS: negative for - bleeding problems, bruising, swollen lymph nodes or weight loss Endocrine ROS: negative for - galactorrhea, hair pattern changes, hot flashes, malaise/lethargy, mood swings, palpitations, polydipsia/polyuria, skin changes, temperature intolerance or unexpected weight changes Breast ROS: negative for - new or changing breast lumps or nipple discharge Respiratory ROS: negative for  - cough or shortness of breath Cardiovascular ROS: negative for - chest pain, irregular heartbeat, palpitations or shortness of breath Gastrointestinal ROS: no abdominal pain, change in bowel habits, or black or bloody stools Genito-Urinary ROS: no dysuria, trouble voiding, or hematuria Musculoskeletal ROS: negative for - joint pain or joint stiffness Neurological ROS: negative for - bowel and bladder control changes Dermatological ROS: negative for rash and skin lesion changes   Objective:   BP (!) 109/53 (BP Location: Left Arm, Patient Position: Sitting, Cuff Size: Normal)   Pulse 64   Ht 5\' 4"  (1.626 m)   Wt 176 lb 1.6 oz (79.9 kg)   LMP 02/07/1991   BMI 30.23 kg/m  CONSTITUTIONAL: Well-developed, well-nourished female in no acute distress.  PSYCHIATRIC: Normal mood and affect. Normal behavior. Normal judgment and thought content. [place]: Alert and oriented to person, place, and time. Normal muscle tone coordination. No cranial nerve deficit noted. HENT:  Normocephalic, atraumatic, External right and left ear normal. Oropharynx is clear and moist EYES: Conjunctivae and EOM are normal. Pupils are equal, round, and reactive to light. No scleral icterus.  NECK: Normal range of motion, supple, no masses.  Normal thyroid.  SKIN: Skin is warm and dry. No rash noted. Not diaphoretic. No erythema. No pallor. CARDIOVASCULAR: Normal heart rate noted, regular rhythm, no murmur. RESPIRATORY: Clear to auscultation bilaterally. Effort and breath sounds normal, no problems with respiration noted. BREASTS: Symmetric in size. No masses, skin changes, nipple drainage, or lymphadenopathy. ABDOMEN: Soft, normal bowel sounds, no distention noted.  No tenderness, rebound or guarding.  BLADDER: Normal PELVIC:  Bladder {bladder exam:311640}  Urethra: {urethra:311719::"not indicated"}  Vulva: {vulva:311722::"not indicated"}  Vagina: {vagina exam:311643::"not indicated"}  Cervix: {cervix:311644::"not  indicated"}  Uterus: {uterus:311718::"not indicated"}  Adnexa: {adnexa:311645::"not indicated"}  RV: {Blank multiple:19196::"External Exam NormaI","No Rectal Masses","Normal Sphincter tone"}  MUSCULOSKELETAL: Normal range of motion. No tenderness.  No cyanosis, clubbing, or edema.  2+ distal pulses. LYMPHATIC: No Axillary, Supraclavicular, or Inguinal Adenopathy.   Labs: Lab Results  Component Value Date   WBC 8.4 02/07/2016   HGB 12.9 02/07/2016   HCT 39.0 02/07/2016   MCV 91 02/07/2016   PLT 206 02/07/2016    Lab Results  Component Value Date   CREATININE 0.95 02/07/2016   BUN 13 02/07/2016   NA 143 02/07/2016   K 4.5 02/07/2016   CL 104 02/07/2016   CO2 23 02/07/2016    Lab Results  Component Value Date   ALT 13 02/07/2016   AST 20 02/07/2016   ALKPHOS 80 02/07/2016   BILITOT 0.3 02/07/2016    Lab Results  Component Value Date   CHOL 228 (H) 01/29/2017   HDL 74 01/29/2017   LDLCALC 136 (H) 01/29/2017   TRIG 90 01/29/2017   CHOLHDL 3.1 01/29/2017    Lab Results  Component Value Date   TSH 0.844 02/07/2016    Lab Results  Component Value Date   HGBA1C 6.2 (H) 01/29/2017  Assessment:   Annual gynecologic examination 66 y.o. Contraception: {method:5051} {Blank multiple:19196::"Normal BMI","Overweight","Obesity 1","Obesity 2"} Problem List Items Addressed This Visit    None      Plan:  Pap: {Blank multiple:19196::"Pap, Reflex if ASCUS","Pap Co Test","GC/CT NAAT","Not needed","Not done"} Mammogram: {Blank multiple:19196::"Ordered","Not Ordered","Not Indicated","***"} Stool Guaiac Testing:  {Blank multiple:19196::"Ordered","Not Ordered","Not Indicated","***"} Labs: {Blank multiple:19196::"Lipid 1","FBS","TSH","Hemoglobin A1C","Vit D Level""***"} Routine preventative health maintenance measures emphasized: {Blank multiple:19196::"Exercise/Diet/Weight control","Tobacco Warnings","Alcohol/Substance use risks","Stress Management","Peer Pressure  Issues","Safe Sex"} *** Return to [place], [place]

## 2017-04-10 DIAGNOSIS — H40003 Preglaucoma, unspecified, bilateral: Secondary | ICD-10-CM | POA: Diagnosis not present

## 2017-05-07 ENCOUNTER — Other Ambulatory Visit: Payer: Self-pay

## 2017-10-09 DIAGNOSIS — H40053 Ocular hypertension, bilateral: Secondary | ICD-10-CM | POA: Diagnosis not present

## 2017-10-09 DIAGNOSIS — H40033 Anatomical narrow angle, bilateral: Secondary | ICD-10-CM | POA: Diagnosis not present

## 2017-10-30 ENCOUNTER — Ambulatory Visit (INDEPENDENT_AMBULATORY_CARE_PROVIDER_SITE_OTHER): Payer: Medicare Other | Admitting: Family Medicine

## 2017-10-30 ENCOUNTER — Encounter: Payer: Self-pay | Admitting: Family Medicine

## 2017-10-30 VITALS — BP 120/60 | HR 60 | Ht 64.0 in | Wt 180.0 lb

## 2017-10-30 DIAGNOSIS — E785 Hyperlipidemia, unspecified: Secondary | ICD-10-CM | POA: Diagnosis not present

## 2017-10-30 DIAGNOSIS — E119 Type 2 diabetes mellitus without complications: Secondary | ICD-10-CM

## 2017-10-30 NOTE — Patient Instructions (Signed)

## 2017-10-30 NOTE — Progress Notes (Signed)
Name: Wendy Tucker   MRN: [id number]    DOB: 1950-08-08   Date:10/30/2017       Progress Note  Subjective  Chief Complaint  Chief Complaint  Patient presents with  . Diabetes    type 2 diab without complication- needs I6N and lipid recheck    Diabetes  She presents for her follow-up (prediabetes) diabetic visit. She has type 2 diabetes mellitus. Her disease course has been stable. There are no hypoglycemic associated symptoms. Pertinent negatives for hypoglycemia include no dizziness, headaches or nervousness/anxiousness. Pertinent negatives for diabetes include no blurred vision, no chest pain, no fatigue, no foot paresthesias, no foot ulcerations, no polydipsia, no polyphagia, no polyuria, no visual change, no weakness and no weight loss. There are no hypoglycemic complications. Symptoms are stable. There are no diabetic complications. There are no known risk factors for coronary artery disease. Current diabetic treatment includes diet. She is compliant with treatment all of the time. She is following a generally healthy diet. Meal planning includes avoidance of concentrated sweets and carbohydrate counting. She has not had a previous visit with a dietitian. She participates in exercise daily. There is no change in her home blood glucose trend. An ACE inhibitor/angiotensin II receptor blocker is not being taken. She does not see a podiatrist.Eye exam is current.  Hyperlipidemia  This is a new problem. The current episode started more than 1 year ago. The problem is controlled. Recent lipid tests were reviewed and are normal. She has no history of chronic renal disease, diabetes, hypothyroidism, liver disease, obesity or nephrotic syndrome. Pertinent negatives include no chest pain, focal weakness, myalgias or shortness of breath.    No problem-specific Assessment & Plan notes found for this encounter.   Past Medical History:  Diagnosis Date  . Eye infection, right   . Vertigo    x1,  over 20 yrs ago    Past Surgical History:  Procedure Laterality Date  . COLONOSCOPY WITH PROPOFOL N/A 11/28/2016   Procedure: COLONOSCOPY WITH PROPOFOL;  Surgeon: Lucilla Lame, MD;  Location: [place];  Service: Endoscopy;  Laterality: N/A;  . ECTOPIC PREGNANCY SURGERY    . fractured leg Right 2008   has metal rods  . POLYPECTOMY  11/28/2016   Procedure: POLYPECTOMY;  Surgeon: Lucilla Lame, MD;  Location: [place];  Service: Endoscopy;;    Family History  Problem Relation Age of Onset  . Hypertension Mother   . Stomach cancer Father   . Breast cancer Sister 8  . Bone cancer Sister     Social History   Socioeconomic History  . Marital status: Single    Spouse name: Not on file  . Number of children: Not on file  . Years of education: Not on file  . Highest education level: Not on file  Occupational History  . Not on file  Social Needs  . Financial resource strain: Not on file  . Food insecurity:    Worry: Not on file    Inability: Not on file  . Transportation needs:    Medical: Not on file    Non-medical: Not on file  Tobacco Use  . Smoking status: Never Smoker  . Smokeless tobacco: Never Used  Substance and Sexual Activity  . Alcohol use: Yes    Comment: Occasionally, 2 glass wine/month  . Drug use: No  . Sexual activity: Yes    Birth control/protection: Post-menopausal  Lifestyle  . Physical activity:    Days per week: Not on file  Minutes per session: Not on file  . Stress: Not on file  Relationships  . Social connections:    Talks on phone: Not on file    Gets together: Not on file    Attends religious service: Not on file    Active member of club or organization: Not on file    Attends meetings of clubs or organizations: Not on file    Relationship status: Not on file  . Intimate partner violence:    Fear of current or ex partner: Not on file    Emotionally abused: Not on file    Physically abused: Not on file    Forced  sexual activity: Not on file  Other Topics Concern  . Not on file  Social History Narrative  . Not on file    Allergies  Allergen Reactions  . Cephalexin   . Amoxicillin Rash    Outpatient Medications Prior to Visit  Medication Sig Dispense Refill  . aspirin 81 MG tablet Take 81 mg by mouth daily.    . cholecalciferol (VITAMIN D) 1000 units tablet Take 1,000 Units by mouth daily.    . cyanocobalamin 500 MCG tablet Take 1 tablet (500 mcg total) by mouth daily.    Marland Kitchen loratadine (CLARITIN) 10 MG tablet Take 10 mg by mouth daily.    . Multiple Minerals-Vitamins (NUTRA-SUPPORT BONE) CAPS Take 1 capsule by mouth daily.     No facility-administered medications prior to visit.     Review of Systems  Constitutional: Negative for chills, fatigue, fever, malaise/fatigue and weight loss.  HENT: Negative for ear discharge, ear pain and sore throat.   Eyes: Negative for blurred vision.  Respiratory: Negative for cough, sputum production, shortness of breath and wheezing.   Cardiovascular: Negative for chest pain, palpitations and leg swelling.  Gastrointestinal: Negative for abdominal pain, blood in stool, constipation, diarrhea, heartburn, melena and nausea.  Genitourinary: Negative for dysuria, frequency, hematuria and urgency.  Musculoskeletal: Negative for back pain, joint pain, myalgias and neck pain.  Skin: Negative for rash.  Neurological: Negative for dizziness, tingling, sensory change, focal weakness, weakness and headaches.  Endo/Heme/Allergies: Negative for environmental allergies, polydipsia and polyphagia. Does not bruise/bleed easily.  Psychiatric/Behavioral: Negative for depression and suicidal ideas. The patient is not nervous/anxious and does not have insomnia.      Objective  Vitals:   10/30/17 0836  BP: 120/60  Pulse: 60  Weight: 180 lb (81.6 kg)  Height: 5\' 4"  (1.626 m)    Physical Exam  Constitutional: She is oriented to person, place, and time. She appears  well-developed and well-nourished. No distress.  HENT:  Head: Normocephalic and atraumatic.  Right Ear: Tympanic membrane and external ear normal.  Left Ear: Tympanic membrane and external ear normal.  Nose: Nose normal.  Mouth/Throat: Oropharynx is clear and moist. No oropharyngeal exudate, posterior oropharyngeal edema or posterior oropharyngeal erythema.  Eyes: Pupils are equal, round, and reactive to light. Conjunctivae and EOM are normal. Lids are everted and swept, no foreign bodies found. Right eye exhibits no discharge. Left eye exhibits no discharge and no hordeolum. No foreign body present in the left eye. Right conjunctiva is not injected. Left conjunctiva is not injected. No scleral icterus.  Neck: Normal range of motion. Neck supple. Normal carotid pulses, no hepatojugular reflux and no JVD present. Carotid bruit is not present. No tracheal deviation present. No thyromegaly present.  Cardiovascular: Normal rate, regular rhythm, S1 normal, S2 normal, normal heart sounds and intact distal pulses. Exam reveals  no gallop, no S3, no S4 and no friction rub.  No murmur heard. Pulses:      Carotid pulses are 2+ on the right side, and 2+ on the left side.      Radial pulses are 2+ on the right side, and 2+ on the left side.       Femoral pulses are 2+ on the right side, and 2+ on the left side.      Popliteal pulses are 2+ on the right side, and 2+ on the left side.       Dorsalis pedis pulses are 2+ on the right side, and 2+ on the left side.       Posterior tibial pulses are 2+ on the right side, and 2+ on the left side.  Pulmonary/Chest: Effort normal and breath sounds normal. No respiratory distress. She has no wheezes. She has no rales.  Abdominal: Soft. Bowel sounds are normal. She exhibits no mass. There is no hepatosplenomegaly. There is no tenderness. There is no rebound, no guarding and no CVA tenderness.  Musculoskeletal: Normal range of motion. She exhibits no edema or tenderness.   Feet:  Right Foot:  Protective Sensation: 10 sites tested. 10 sites sensed.  Skin Integrity: Negative for ulcer, blister, skin breakdown, erythema, warmth, callus or dry skin.  Left Foot:  Protective Sensation: 10 sites tested. 10 sites sensed.  Skin Integrity: Negative for ulcer, blister, skin breakdown, erythema, warmth, callus or dry skin.  Lymphadenopathy:    She has no cervical adenopathy.  Neurological: She is alert and oriented to person, place, and time. She has normal strength and normal reflexes. She displays normal reflexes. No cranial nerve deficit.  Skin: Skin is warm and dry. No rash noted. She is not diaphoretic.  Psychiatric: She has a normal mood and affect. Her mood appears not anxious. She does not exhibit a depressed mood.  Nursing note and vitals reviewed.     Assessment & Plan  Problem List Items Addressed This Visit      Other   Hyperlipidemia   Relevant Orders   Lipid Profile    Other Visit Diagnoses    Type 2 diabetes mellitus without complication, without long-term current use of insulin ([place])   (Chronic)  -  Primary   Relevant Orders   Hemoglobin A1c      No orders of the defined types were placed in this encounter.     Dr. [facility]  10/30/17

## 2017-10-31 LAB — LIPID PANEL
CHOLESTEROL TOTAL: 225 mg/dL — AB (ref 100–199)
Chol/HDL Ratio: 3.2 ratio (ref 0.0–4.4)
HDL: 70 mg/dL (ref >39)
LDL CALC: 140 mg/dL — AB (ref 0–99)
TRIGLYCERIDES: 76 mg/dL (ref 0–149)
VLDL CHOLESTEROL CAL: 15 mg/dL (ref 5–40)

## 2017-10-31 LAB — HEMOGLOBIN A1C
Est. average glucose Bld gHb Est-mCnc: 134 mg/dL
Hgb A1c MFr Bld: 6.3 % — ABNORMAL HIGH (ref 4.8–5.6)

## 2017-11-11 ENCOUNTER — Telehealth: Payer: Self-pay | Admitting: *Deleted

## 2017-11-11 NOTE — Telephone Encounter (Signed)
Patient called and states she wants Dr. Marcelline Mates to put in an order for her to get her mammogram done. Patient goes to [facility]. Her contact # is [phone]. Please advise. Thank you

## 2017-11-12 ENCOUNTER — Other Ambulatory Visit: Payer: Self-pay

## 2017-11-13 ENCOUNTER — Telehealth: Payer: Self-pay | Admitting: *Deleted

## 2017-11-13 NOTE — Telephone Encounter (Signed)
Please see another encounter.

## 2017-11-13 NOTE — Telephone Encounter (Signed)
Pt was called no answer left message and informed that she should be able to set an appt without an order if she called and they stated that she needed one to please call the office for one.

## 2017-11-13 NOTE — Telephone Encounter (Signed)
Patient called and states she called back to [facility] and they need an order for the mammogram. Please advise. Thank you

## 2017-11-13 NOTE — Telephone Encounter (Signed)
Pt was called back and informed that a called had been made to [facility] and they stated that the pt had an order in for a mammogram pt was informed if she calls again and they tell her she do not have an order that someone at the office here will call and make her an appt. Pt stated that [place] told her she didn't. Pt stated that she would call again this evening when she got home.

## 2017-11-27 ENCOUNTER — Ambulatory Visit: Payer: Medicare HMO

## 2017-12-04 ENCOUNTER — Ambulatory Visit
Admission: RE | Admit: 2017-12-04 | Discharge: 2017-12-04 | Disposition: A | Discharge status: Home / Self Care | Payer: Medicare Other | Source: Ambulatory Visit | Attending: Obstetrics and Gynecology | Admitting: Obstetrics and Gynecology

## 2017-12-04 DIAGNOSIS — Z01419 Encounter for gynecological examination (general) (routine) without abnormal findings: Secondary | ICD-10-CM | POA: Insufficient documentation

## 2017-12-04 DIAGNOSIS — Z1231 Encounter for screening mammogram for malignant neoplasm of breast: Secondary | ICD-10-CM | POA: Insufficient documentation

## 2017-12-04 DIAGNOSIS — Z803 Family history of malignant neoplasm of breast: Secondary | ICD-10-CM | POA: Insufficient documentation

## 2018-04-30 ENCOUNTER — Ambulatory Visit: Payer: Medicare Other | Admitting: Family Medicine

## 2018-05-07 ENCOUNTER — Encounter: Payer: Self-pay | Admitting: Family Medicine

## 2018-05-07 ENCOUNTER — Ambulatory Visit (INDEPENDENT_AMBULATORY_CARE_PROVIDER_SITE_OTHER): Payer: Medicare Other | Admitting: Family Medicine

## 2018-05-07 VITALS — BP 100/62 | HR 80 | Ht 64.0 in | Wt 177.0 lb

## 2018-05-07 DIAGNOSIS — E669 Obesity, unspecified: Secondary | ICD-10-CM

## 2018-05-07 DIAGNOSIS — E119 Type 2 diabetes mellitus without complications: Secondary | ICD-10-CM

## 2018-05-07 DIAGNOSIS — E538 Deficiency of other specified B group vitamins: Secondary | ICD-10-CM

## 2018-05-07 DIAGNOSIS — E785 Hyperlipidemia, unspecified: Secondary | ICD-10-CM

## 2018-05-07 MED ORDER — CYANOCOBALAMIN 500 MCG PO TABS: 500.0000 ug | ORAL_TABLET | Freq: Every day | ORAL | Status: AC

## 2018-05-07 MED ORDER — ASPIRIN 81 MG PO TABS: 81.0000 mg | ORAL_TABLET | Freq: Every day | ORAL | Status: AC

## 2018-05-07 NOTE — Progress Notes (Signed)
Date:  05/07/2018   Name:  Wendy Tucker   DOB:  1950-11-16   MRN:  [id number]   Chief Complaint: Diabetes (needs urine micro) Diabetes  She presents for her follow-up diabetic visit. She has type 2 diabetes mellitus. Her disease course has been stable. There are no hypoglycemic associated symptoms. Pertinent negatives for hypoglycemia include no dizziness, headaches or nervousness/anxiousness. Pertinent negatives for diabetes include no blurred vision, no chest pain, no fatigue, no foot paresthesias, no foot ulcerations, no polydipsia, no polyphagia, no polyuria, no visual change, no weakness and no weight loss. There are no hypoglycemic complications. Symptoms are stable. There are no diabetic complications. There are no known risk factors for coronary artery disease. Current diabetic treatment includes diet. She is compliant with treatment all of the time. She is following a generally healthy diet. She participates in exercise daily. An ACE inhibitor/angiotensin II receptor blocker is not being taken. She does not see a podiatrist.Eye exam is not current.     Review of Systems  Constitutional: Negative.  Negative for chills, fatigue, fever, unexpected weight change and weight loss.  HENT: Negative for congestion, ear discharge, ear pain, rhinorrhea, sinus pressure, sneezing and sore throat.   Eyes: Negative for blurred vision, photophobia, pain, discharge, redness and itching.  Respiratory: Negative for cough, shortness of breath, wheezing and stridor.   Cardiovascular: Negative for chest pain.  Gastrointestinal: Negative for abdominal pain, blood in stool, constipation, diarrhea, nausea and vomiting.  Endocrine: Negative for cold intolerance, heat intolerance, polydipsia, polyphagia and polyuria.  Genitourinary: Negative for dysuria, flank pain, frequency, hematuria, menstrual problem, pelvic pain, urgency, vaginal bleeding and vaginal discharge.  Musculoskeletal: Negative for  arthralgias, back pain and myalgias.  Skin: Negative for rash.  Allergic/Immunologic: Negative for environmental allergies and food allergies.  Neurological: Negative for dizziness, weakness, light-headedness, numbness and headaches.  Hematological: Negative for adenopathy. Does not bruise/bleed easily.  Psychiatric/Behavioral: Negative for dysphoric mood. The patient is not nervous/anxious.     Patient Active Problem List   Diagnosis Date Noted  . Special screening for malignant neoplasms, colon   . Polyp of sigmoid colon   . Benign neoplasm of descending colon   . Hyperlipidemia 11/21/2016  . Vitamin D deficiency 10/24/2016  . Osteopenia 10/24/2016  . Diabetes mellitus type 2, controlled, without complications ([place]) 81/15/7262  . B12 deficiency 10/24/2016  . Obesity (BMI 30.0-34.9) 10/24/2016  . FH: Alzheimer's disease 10/24/2016  . FH: gastric cancer 10/24/2016  . FH: breast cancer 10/24/2016    Allergies  Allergen Reactions  . Cephalexin   . Amoxicillin Rash    Past Surgical History:  Procedure Laterality Date  . BREAST BIOPSY Bilateral    neg- needle bx/clips  . COLONOSCOPY WITH PROPOFOL N/A 11/28/2016   Procedure: COLONOSCOPY WITH PROPOFOL;  Surgeon: Lucilla Lame, MD;  Location: [place];  Service: Endoscopy;  Laterality: N/A;  . ECTOPIC PREGNANCY SURGERY    . fractured leg Right 2008   has metal rods  . POLYPECTOMY  11/28/2016   Procedure: POLYPECTOMY;  Surgeon: Lucilla Lame, MD;  Location: [place];  Service: Endoscopy;;    Social History   Tobacco Use  . Smoking status: Never Smoker  . Smokeless tobacco: Never Used  Substance Use Topics  . Alcohol use: Yes    Comment: Occasionally, 2 glass wine/month  . Drug use: No     Medication list has been reviewed and updated.  Current Meds  Medication Sig  . aspirin 81 MG tablet  Take 81 mg by mouth daily.  . cholecalciferol (VITAMIN D) 1000 units tablet Take 1,000 Units by mouth daily.    . cyanocobalamin 500 MCG tablet Take 1 tablet (500 mcg total) by mouth daily.  Marland Kitchen loratadine (CLARITIN) 10 MG tablet Take 10 mg by mouth daily.  . Multiple Minerals-Vitamins (NUTRA-SUPPORT BONE) CAPS Take 1 capsule by mouth daily.    PHQ 2/9 Scores 05/07/2018 01/29/2017 01/02/2017 10/24/2016  PHQ - 2 Score 0 0 0 0  PHQ- 9 Score 0 - - -    Physical Exam  Constitutional: No distress.  HENT:  Head: Normocephalic and atraumatic.  Right Ear: External ear normal.  Left Ear: External ear normal.  Nose: Nose normal.  Mouth/Throat: Oropharynx is clear and moist.  Eyes: Pupils are equal, round, and reactive to light. Conjunctivae and EOM are normal. Right eye exhibits no discharge. Left eye exhibits no discharge.  Neck: Normal range of motion. Neck supple. No JVD present. No thyromegaly present.  Cardiovascular: Normal rate, regular rhythm, normal heart sounds and intact distal pulses. Exam reveals no gallop and no friction rub.  No murmur heard. Pulmonary/Chest: Effort normal and breath sounds normal. She has no wheezes.  Abdominal: Soft. Bowel sounds are normal. She exhibits no mass. There is no tenderness. There is no guarding.  Musculoskeletal: Normal range of motion. She exhibits no edema.  Lymphadenopathy:    She has no cervical adenopathy.  Neurological: She is alert. She has normal reflexes.  Skin: Skin is warm and dry. She is not diaphoretic.     Nursing note and vitals reviewed.   BP 100/62   Pulse 80   Ht 5\' 4"  (1.626 m)   Wt 177 lb (80.3 kg)   LMP 02/07/1991   BMI 30.38 kg/m   Assessment and Plan:  1. Controlled type 2 diabetes mellitus without complication, without long-term current use of insulin ([facility]) Stable with diet/ draw A1C, get microalbumin and continue otc Aspirin - Hemoglobin A1c - Microalbumin / creatinine urine ratio - aspirin 81 MG tablet; Take 1 tablet (81 mg total) by mouth daily.  Dispense: 30 tablet  2. Hyperlipidemia, unspecified hyperlipidemia  type Draw lipid panel - Lipid panel  3. Obesity (BMI 30.0-34.9) Discussed diet and exercise  4. B12 deficiency Continue otc B12 - vitamin B-12 (CYANOCOBALAMIN) 500 MCG tablet; Take 1 tablet (500 mcg total) by mouth daily.    Dr. [facility]  05/07/2018

## 2018-05-07 NOTE — Patient Instructions (Signed)

## 2018-05-08 LAB — LIPID PANEL
CHOL/HDL RATIO: 3.3 ratio (ref 0.0–4.4)
CHOLESTEROL TOTAL: 225 mg/dL — AB (ref 100–199)
HDL: 68 mg/dL (ref >39)
LDL CALC: 145 mg/dL — AB (ref 0–99)
TRIGLYCERIDES: 61 mg/dL (ref 0–149)
VLDL Cholesterol Cal: 12 mg/dL (ref 5–40)

## 2018-05-08 LAB — HEMOGLOBIN A1C
Est. average glucose Bld gHb Est-mCnc: 137 mg/dL
Hgb A1c MFr Bld: 6.4 % — ABNORMAL HIGH (ref 4.8–5.6)

## 2018-05-08 LAB — MICROALBUMIN / CREATININE URINE RATIO
CREATININE, UR: 233.5 mg/dL
Microalb/Creat Ratio: 2.7 mg/g creat (ref 0.0–30.0)
Microalbumin, Urine: 6.2 ug/mL

## 2018-10-15 ENCOUNTER — Encounter: Payer: Medicare Other | Admitting: Obstetrics and Gynecology

## 2018-10-15 ENCOUNTER — Encounter

## 2018-10-29 ENCOUNTER — Encounter: Payer: Medicare Other | Admitting: Obstetrics and Gynecology

## 2018-10-29 ENCOUNTER — Encounter

## 2019-01-14 ENCOUNTER — Other Ambulatory Visit: Payer: Self-pay | Admitting: Internal Medicine

## 2019-01-14 ENCOUNTER — Other Ambulatory Visit: Payer: Self-pay | Admitting: Obstetrics and Gynecology

## 2019-01-14 DIAGNOSIS — Z1231 Encounter for screening mammogram for malignant neoplasm of breast: Secondary | ICD-10-CM

## 2019-01-20 ENCOUNTER — Telehealth: Payer: Self-pay

## 2019-01-20 NOTE — Telephone Encounter (Signed)
Pt called no answer [facility] for prescreening.

## 2019-01-20 NOTE — Patient Instructions (Signed)

## 2019-01-21 ENCOUNTER — Other Ambulatory Visit: Payer: Self-pay

## 2019-01-21 ENCOUNTER — Encounter: Payer: Self-pay | Admitting: Obstetrics and Gynecology

## 2019-01-21 ENCOUNTER — Other Ambulatory Visit (HOSPITAL_COMMUNITY)
Admission: RE | Admit: 2019-01-21 | Discharge: 2019-01-21 | Disposition: A | Discharge status: Home / Self Care | Payer: Medicare Other | Source: Ambulatory Visit | Attending: Obstetrics and Gynecology | Admitting: Obstetrics and Gynecology

## 2019-01-21 ENCOUNTER — Ambulatory Visit (INDEPENDENT_AMBULATORY_CARE_PROVIDER_SITE_OTHER): Payer: Medicare Other | Admitting: Obstetrics and Gynecology

## 2019-01-21 VITALS — BP 133/64 | HR 61 | Ht 64.0 in | Wt 181.9 lb

## 2019-01-21 DIAGNOSIS — E669 Obesity, unspecified: Secondary | ICD-10-CM

## 2019-01-21 DIAGNOSIS — M85859 Other specified disorders of bone density and structure, unspecified thigh: Secondary | ICD-10-CM

## 2019-01-21 DIAGNOSIS — E559 Vitamin D deficiency, unspecified: Secondary | ICD-10-CM

## 2019-01-21 DIAGNOSIS — Z124 Encounter for screening for malignant neoplasm of cervix: Secondary | ICD-10-CM

## 2019-01-21 DIAGNOSIS — Z01419 Encounter for gynecological examination (general) (routine) without abnormal findings: Secondary | ICD-10-CM | POA: Insufficient documentation

## 2019-01-21 DIAGNOSIS — Z803 Family history of malignant neoplasm of breast: Secondary | ICD-10-CM

## 2019-01-21 DIAGNOSIS — E785 Hyperlipidemia, unspecified: Secondary | ICD-10-CM

## 2019-01-21 DIAGNOSIS — E119 Type 2 diabetes mellitus without complications: Secondary | ICD-10-CM

## 2019-01-21 NOTE — Progress Notes (Signed)
Pt is present today for annual exam. Pt stated that she was doing well no problems.  

## 2019-01-21 NOTE — Progress Notes (Signed)
ANNUAL PREVENTATIVE CARE GYNECOLOGY  ENCOUNTER NOTE  Subjective:       Wendy Tucker is a 68 y.o. G1P0010 single post-menopausal female here for a routine annual gynecologic exam. The patient is sexually active. The patient is not taking hormone replacement therapy. Patient denies post-menopausal vaginal bleeding. The patient wears seatbelts: yes. The patient participates in regular exercise: yes (treadmill twice daily). Has the patient ever been transfused or tattooed?: no. The patient reports that there is not domestic violence in her life.  Current complaints: 1.  None   Gynecologic History Patient's last menstrual period was 02/07/1991.  Patient is post-menopausal.  Contraception: post menopausal status Last Pap: 02/2016.  Results were: normal Last mammogram: 12/04/2017. Results were: normal.  Next scheduled for 02/01/2019.  Last Colonoscopy: 11/28/2016.  Results were: Benign polyps, diverticulosis, and internal hemorrhoids. Can repeat in 10 years.  Last Dexa Scan: 04/2016.  Results were osteopenia (T score -1.4).   Obstetric History OB History  Gravida Para Term Preterm AB Living  1       1    SAB TAB Ectopic Multiple Live Births      1        # Outcome Date GA Lbr Len/2nd Weight Sex Delivery Anes PTL Lv  1 Ectopic             Past Medical History:  Diagnosis Date  . Eye infection, right   . Vertigo    x1, over 20 yrs ago    Family History  Problem Relation Age of Onset  . Hypertension Mother   . Stomach cancer Father   . Breast cancer Sister 11  . Bone cancer Sister     Past Surgical History:  Procedure Laterality Date  . BREAST BIOPSY Bilateral    neg- needle bx/clips  . COLONOSCOPY WITH PROPOFOL N/A 11/28/2016   Procedure: COLONOSCOPY WITH PROPOFOL;  Surgeon: Lucilla Lame, MD;  Location: [facility];  Service: Endoscopy;  Laterality: N/A;  . ECTOPIC PREGNANCY SURGERY    . fractured leg Right 2008   has metal rods  . POLYPECTOMY  11/28/2016    Procedure: POLYPECTOMY;  Surgeon: Lucilla Lame, MD;  Location: [facility];  Service: Endoscopy;;    Social History   Socioeconomic History  . Marital status: Single    Spouse name: Not on file  . Number of children: Not on file  . Years of education: Not on file  . Highest education level: Not on file  Occupational History  . Not on file  Social Needs  . Financial resource strain: Not on file  . Food insecurity    Worry: Not on file    Inability: Not on file  . Transportation needs    Medical: Not on file    Non-medical: Not on file  Tobacco Use  . Smoking status: Never Smoker  . Smokeless tobacco: Never Used  Substance and Sexual Activity  . Alcohol use: Yes    Comment: Occasionally, 2 glass wine/month  . Drug use: No  . Sexual activity: Yes    Birth control/protection: Post-menopausal  Lifestyle  . Physical activity    Days per week: Not on file    Minutes per session: Not on file  . Stress: Not on file  Relationships  . Social Herbalist on phone: Not on file    Gets together: Not on file    Attends religious service: Not on file    Active member of club or  organization: Not on file    Attends meetings of clubs or organizations: Not on file    Relationship status: Not on file  . Intimate partner violence    Fear of current or ex partner: Not on file    Emotionally abused: Not on file    Physically abused: Not on file    Forced sexual activity: Not on file  Other Topics Concern  . Not on file  Social History Narrative  . Not on file    Current Outpatient Medications on File Prior to Visit  Medication Sig Dispense Refill  . aspirin 81 MG tablet Take 1 tablet (81 mg total) by mouth daily. 30 tablet   . cholecalciferol (VITAMIN D) 1000 units tablet Take 1,000 Units by mouth daily.    Marland Kitchen loratadine (CLARITIN) 10 MG tablet Take 10 mg by mouth daily.    . Multiple Minerals-Vitamins (NUTRA-SUPPORT BONE) CAPS Take 1 capsule by mouth daily.    .  vitamin B-12 (CYANOCOBALAMIN) 500 MCG tablet Take 1 tablet (500 mcg total) by mouth daily.     No current facility-administered medications on file prior to visit.     Allergies  Allergen Reactions  . Cephalexin   . Amoxicillin Rash     Review of Systems ROS General ROS:-  negative for - chills, fatigue, fever, hot flashes, night sweats, weight gain.   Psychological ROS: negative for - anxiety, decreased libido, depression, mood swings, physical abuse or sexual abuse Ophthalmic ROS: negative for - blurry vision, eye pain or loss of vision ENT ROS: negative for - headaches, hearing change, visual changes or vocal changes Allergy and Immunology ROS: negative for - hives, itchy/watery eyes or seasonal allergies Hematological and Lymphatic ROS: negative for - bleeding problems, bruising, swollen lymph nodes or weight loss Endocrine ROS: negative for - galactorrhea, hair pattern changes, hot flashes, malaise/lethargy, mood swings, palpitations, polydipsia/polyuria, skin changes, temperature intolerance or unexpected weight changes Breast ROS: negative for - new or changing breast lumps or nipple discharge Respiratory ROS: negative for - cough or shortness of breath Cardiovascular ROS: negative for - chest pain, irregular heartbeat, palpitations or shortness of breath Gastrointestinal ROS: no abdominal pain, change in bowel habits, or black or bloody stools Genito-Urinary ROS: no dysuria, trouble voiding, or hematuria Musculoskeletal ROS: negative for - joint pain or joint stiffness Neurological ROS: negative for - bowel and bladder control changes Dermatological ROS: negative for rash and skin lesion changes   Objective:   BP 133/64   Pulse 61   Ht 5\' 4"  (1.626 m)   Wt 181 lb 14.4 oz (82.5 kg)   LMP 02/07/1991   BMI 31.22 kg/m  CONSTITUTIONAL: Well-developed, well-nourished female in no acute distress. Mild obesity PSYCHIATRIC: Normal mood and affect. Normal behavior. Normal  judgment and thought content. [place]: Alert and oriented to person, place, and time. Normal muscle tone coordination. No cranial nerve deficit noted. HENT:  Normocephalic, atraumatic, External right and left ear normal. Oropharynx is clear and moist EYES: Conjunctivae and EOM are normal. Pupils are equal, round, and reactive to light. No scleral icterus.  NECK: Normal range of motion, supple, no masses.  Normal thyroid.  SKIN: Skin is warm and dry. No rash noted. Not diaphoretic. No erythema. No pallor. CARDIOVASCULAR: Normal heart rate noted, regular rhythm, no murmur. RESPIRATORY: Clear to auscultation bilaterally. Effort and breath sounds normal, no problems with respiration noted. BREASTS: Symmetric in size. No masses, skin changes, nipple drainage, or lymphadenopathy. ABDOMEN: Soft, normal bowel sounds, no distention  noted.  No tenderness, rebound or guarding.  BLADDER: Normal PELVIC: external genitalia normal, rectovaginal septum normal.  Vagina without discharge, atrophy present.  Cervix normal appearing, no lesions and no motion tenderness.  Uterus mobile, nontender, normal shape and size.  Adnexae non-palpable, nontender bilaterally.  RECTAL: External Exam NormaI, No Rectal Masses and Normal Sphincter tone  MUSCULOSKELETAL: Normal range of motion. No tenderness.  No cyanosis, clubbing, or edema.  2+ distal pulses. LYMPHATIC: No Axillary, Supraclavicular, or Inguinal Adenopathy.   Labs:  . Lab Results  Component Value Date   WBC 8.4 02/07/2016   HGB 12.9 02/07/2016   HCT 39.0 02/07/2016   MCV 91 02/07/2016   PLT 206 02/07/2016    Lab Results  Component Value Date   CREATININE 0.95 02/07/2016   BUN 13 02/07/2016   NA 143 02/07/2016   K 4.5 02/07/2016   CL 104 02/07/2016   CO2 23 02/07/2016    Lab Results  Component Value Date   ALT 13 02/07/2016   AST 20 02/07/2016   ALKPHOS 80 02/07/2016   BILITOT 0.3 02/07/2016    Lab Results  Component Value Date   CHOL  225 (H) 05/07/2018   HDL 68 05/07/2018   LDLCALC 145 (H) 05/07/2018   TRIG 61 05/07/2018   CHOLHDL 3.3 05/07/2018     Lab Results  Component Value Date   HGBA1C 6.4 (H) 05/07/2018    Lab Results  Component Value Date   TSH 0.844 02/07/2016     Assessment:   Encounter for well woman exam with routine gynecological exam  Family history of breast cancer in sister  Osteopenia of neck of femur, unspecified laterality Mild obesity Dyslipidemia Type 2 diabetes mellitus without complication, without long-term current use of insulin ([place])  Plan:   Pap smear up to date.  No longer requires further cervical cancer screening based on age.  Mammogram: Scheduled for the end of the month.  Stool Guaiac Testing: not indicated. Colonoscopy up to date.  Recommend repeat in 10 years.  Labs: None.  Drawn by PCP q 6 months.   Routine preventative health maintenance measures emphasized: Exercise/Diet/Weight control, Alcohol/Substance use risks and Stress Management.  Dyslipidemia and Type II DM managed by PCP.  Discussed healthy lifestyle modifications.  Osteopenia: Counseled on Calcium and Vitamin D supplementation.  Return to [place], MD  [facility]

## 2019-01-21 NOTE — Addendum Note (Signed)
Addended by: Edwyna Shell on: 01/21/2019 09:38 AM   Modules accepted: Orders

## 2019-01-26 LAB — CYTOLOGY - PAP
Adequacy: ABSENT
Diagnosis: NEGATIVE
Diagnosis: REACTIVE

## 2019-02-01 ENCOUNTER — Ambulatory Visit
Admission: RE | Admit: 2019-02-01 | Discharge: 2019-02-01 | Disposition: A | Discharge status: Home / Self Care | Payer: Medicare Other | Source: Ambulatory Visit | Attending: Obstetrics and Gynecology | Admitting: Obstetrics and Gynecology

## 2019-02-01 ENCOUNTER — Other Ambulatory Visit: Payer: Self-pay

## 2019-02-01 DIAGNOSIS — Z1231 Encounter for screening mammogram for malignant neoplasm of breast: Secondary | ICD-10-CM | POA: Insufficient documentation

## 2019-02-02 ENCOUNTER — Other Ambulatory Visit: Payer: Self-pay | Admitting: Obstetrics and Gynecology

## 2019-02-02 DIAGNOSIS — R921 Mammographic calcification found on diagnostic imaging of breast: Secondary | ICD-10-CM

## 2019-02-02 DIAGNOSIS — R928 Other abnormal and inconclusive findings on diagnostic imaging of breast: Secondary | ICD-10-CM

## 2019-02-11 DIAGNOSIS — H40053 Ocular hypertension, bilateral: Secondary | ICD-10-CM | POA: Diagnosis not present

## 2019-02-11 DIAGNOSIS — H2513 Age-related nuclear cataract, bilateral: Secondary | ICD-10-CM | POA: Diagnosis not present

## 2019-02-12 ENCOUNTER — Ambulatory Visit
Admission: RE | Admit: 2019-02-12 | Discharge: 2019-02-12 | Disposition: A | Discharge status: Home / Self Care | Payer: Medicare Other | Source: Ambulatory Visit | Attending: Obstetrics and Gynecology | Admitting: Obstetrics and Gynecology

## 2019-02-12 DIAGNOSIS — R928 Other abnormal and inconclusive findings on diagnostic imaging of breast: Secondary | ICD-10-CM

## 2019-02-12 DIAGNOSIS — R921 Mammographic calcification found on diagnostic imaging of breast: Secondary | ICD-10-CM | POA: Diagnosis not present

## 2019-02-15 ENCOUNTER — Other Ambulatory Visit: Payer: Self-pay | Admitting: Obstetrics and Gynecology

## 2019-02-15 DIAGNOSIS — R921 Mammographic calcification found on diagnostic imaging of breast: Secondary | ICD-10-CM

## 2019-02-15 DIAGNOSIS — R928 Other abnormal and inconclusive findings on diagnostic imaging of breast: Secondary | ICD-10-CM

## 2019-03-14 DIAGNOSIS — E119 Type 2 diabetes mellitus without complications: Secondary | ICD-10-CM | POA: Diagnosis not present

## 2019-03-18 ENCOUNTER — Ambulatory Visit
Admission: RE | Admit: 2019-03-18 | Discharge: 2019-03-18 | Disposition: A | Discharge status: Home / Self Care | Payer: Medicare Other | Source: Ambulatory Visit | Attending: Obstetrics and Gynecology | Admitting: Obstetrics and Gynecology

## 2019-03-18 DIAGNOSIS — N6081 Other benign mammary dysplasias of right breast: Secondary | ICD-10-CM | POA: Diagnosis not present

## 2019-03-18 DIAGNOSIS — R928 Other abnormal and inconclusive findings on diagnostic imaging of breast: Secondary | ICD-10-CM

## 2019-03-18 DIAGNOSIS — R921 Mammographic calcification found on diagnostic imaging of breast: Secondary | ICD-10-CM

## 2019-03-18 HISTORY — PX: BREAST BIOPSY: SHX20

## 2019-03-22 LAB — SURGICAL PATHOLOGY

## 2019-03-23 ENCOUNTER — Other Ambulatory Visit: Payer: Self-pay | Admitting: Obstetrics and Gynecology

## 2019-03-23 DIAGNOSIS — Z87898 Personal history of other specified conditions: Secondary | ICD-10-CM

## 2019-03-23 NOTE — Progress Notes (Signed)
Diagnostic imaging order placed for 6 month follow up of right breast.

## 2019-03-24 LAB — HEMOGLOBIN A1C: Hemoglobin A1C: 6.3

## 2019-03-25 ENCOUNTER — Other Ambulatory Visit: Payer: Self-pay

## 2019-03-25 ENCOUNTER — Ambulatory Visit (INDEPENDENT_AMBULATORY_CARE_PROVIDER_SITE_OTHER): Payer: Medicare Other

## 2019-03-25 DIAGNOSIS — Z23 Encounter for immunization: Secondary | ICD-10-CM | POA: Diagnosis not present

## 2019-04-09 ENCOUNTER — Other Ambulatory Visit: Payer: Self-pay

## 2019-05-19 ENCOUNTER — Other Ambulatory Visit: Payer: Self-pay

## 2019-05-27 ENCOUNTER — Ambulatory Visit (INDEPENDENT_AMBULATORY_CARE_PROVIDER_SITE_OTHER): Payer: Medicare Other | Admitting: Family Medicine

## 2019-05-27 ENCOUNTER — Encounter: Payer: Self-pay | Admitting: Family Medicine

## 2019-05-27 ENCOUNTER — Other Ambulatory Visit: Payer: Self-pay

## 2019-05-27 VITALS — BP 130/60 | HR 80 | Ht 64.0 in | Wt 178.0 lb

## 2019-05-27 DIAGNOSIS — E7801 Familial hypercholesterolemia: Secondary | ICD-10-CM | POA: Diagnosis not present

## 2019-05-27 DIAGNOSIS — R6889 Other general symptoms and signs: Secondary | ICD-10-CM | POA: Diagnosis not present

## 2019-05-27 DIAGNOSIS — R7303 Prediabetes: Secondary | ICD-10-CM | POA: Diagnosis not present

## 2019-05-27 DIAGNOSIS — Z23 Encounter for immunization: Secondary | ICD-10-CM | POA: Diagnosis not present

## 2019-05-27 DIAGNOSIS — E669 Obesity, unspecified: Secondary | ICD-10-CM

## 2019-05-27 NOTE — Progress Notes (Signed)
Date:  05/27/2019   Name:  Wendy Tucker   DOB:  1951-01-10   MRN:  [id number]   Chief Complaint: prediabetes and pneum vacc 23  Diabetes She presents for her follow-up diabetic visit. She has type 2 diabetes mellitus. Her disease course has been stable. There are no hypoglycemic associated symptoms. Pertinent negatives for hypoglycemia include no dizziness, headaches or nervousness/anxiousness. There are no diabetic associated symptoms. Pertinent negatives for diabetes include no blurred vision, no chest pain, no fatigue, no foot paresthesias, no foot ulcerations, no polydipsia, no polyphagia, no polyuria, no visual change, no weakness and no weight loss. There are no hypoglycemic complications. Symptoms are stable. There are no diabetic complications. Risk factors for coronary artery disease include dyslipidemia. Current diabetic treatment includes diet. She is compliant with treatment most of the time. Her weight is stable. She is following a generally healthy diet. Meal planning includes avoidance of concentrated sweets and carbohydrate counting. She participates in exercise intermittently. Home blood sugar record trend: does not check. An ACE inhibitor/angiotensin II receptor blocker is not being taken.  Hyperlipidemia This is a chronic problem. The current episode started more than 1 year ago. The problem is controlled. Recent lipid tests were reviewed and are normal. Exacerbating diseases include diabetes and obesity. She has no history of chronic renal disease, hypothyroidism, liver disease or nephrotic syndrome. Pertinent negatives include no chest pain, focal sensory loss, focal weakness, leg pain, myalgias or shortness of breath. Current antihyperlipidemic treatment includes diet change. The current treatment provides moderate improvement of lipids. There are no compliance problems.     Lab Results  Component Value Date   CREATININE 0.95 02/07/2016   BUN 13 02/07/2016   NA 143  02/07/2016   K 4.5 02/07/2016   CL 104 02/07/2016   CO2 23 02/07/2016   Lab Results  Component Value Date   CHOL 225 (H) 05/07/2018   HDL 68 05/07/2018   LDLCALC 145 (H) 05/07/2018   TRIG 61 05/07/2018   CHOLHDL 3.3 05/07/2018   Lab Results  Component Value Date   TSH 0.844 02/07/2016   Lab Results  Component Value Date   HGBA1C 6.3 03/24/2019     Review of Systems  Constitutional: Negative.  Negative for chills, fatigue, fever, unexpected weight change and weight loss.  HENT: Negative for congestion, ear discharge, ear pain, rhinorrhea, sinus pressure, sneezing and sore throat.   Eyes: Negative for blurred vision, photophobia, pain, discharge, redness and itching.  Respiratory: Negative for cough, shortness of breath, wheezing and stridor.   Cardiovascular: Negative for chest pain.  Gastrointestinal: Negative for abdominal pain, blood in stool, constipation, diarrhea, nausea and vomiting.  Endocrine: Negative for cold intolerance, heat intolerance, polydipsia, polyphagia and polyuria.  Genitourinary: Negative for dysuria, flank pain, frequency, hematuria, menstrual problem, pelvic pain, urgency, vaginal bleeding and vaginal discharge.  Musculoskeletal: Negative for arthralgias, back pain and myalgias.  Skin: Negative for rash.  Allergic/Immunologic: Negative for environmental allergies and food allergies.  Neurological: Negative for dizziness, focal weakness, weakness, light-headedness, numbness and headaches.  Hematological: Negative for adenopathy. Does not bruise/bleed easily.  Psychiatric/Behavioral: Negative for dysphoric mood. The patient is not nervous/anxious.     Patient Active Problem List   Diagnosis Date Noted  . Special screening for malignant neoplasms, colon   . Polyp of sigmoid colon   . Benign neoplasm of descending colon   . Hyperlipidemia 11/21/2016  . Vitamin D deficiency 10/24/2016  . Osteopenia 10/24/2016  . Diabetes mellitus type 2,  controlled,  without complications ([place]) [id number]  . B12 deficiency 10/24/2016  . Obesity (BMI 30.0-34.9) 10/24/2016  . FH: Alzheimer's disease 10/24/2016  . FH: gastric cancer 10/24/2016  . FH: breast cancer 10/24/2016    Allergies  Allergen Reactions  . Cephalexin   . Amoxicillin Rash    Past Surgical History:  Procedure Laterality Date  . BREAST BIOPSY Bilateral    neg- needle bx/clips  . BREAST BIOPSY Right 03/18/2019   affirm bx of calcs medial, coil clip, path pending  . COLONOSCOPY WITH PROPOFOL N/A 11/28/2016   Procedure: COLONOSCOPY WITH PROPOFOL;  Surgeon: Lucilla Lame, MD;  Location: [place];  Service: Endoscopy;  Laterality: N/A;  . ECTOPIC PREGNANCY SURGERY    . fractured leg Right 2008   has metal rods  . POLYPECTOMY  11/28/2016   Procedure: POLYPECTOMY;  Surgeon: Lucilla Lame, MD;  Location: [place];  Service: Endoscopy;;    Social History   Tobacco Use  . Smoking status: Never Smoker  . Smokeless tobacco: Never Used  Substance Use Topics  . Alcohol use: Yes    Comment: Occasionally, 2 glass wine/month  . Drug use: No     Medication list has been reviewed and updated.  Current Meds  Medication Sig  . aspirin 81 MG tablet Take 1 tablet (81 mg total) by mouth daily.  . cholecalciferol (VITAMIN D) 1000 units tablet Take 1,000 Units by mouth daily.  Marland Kitchen loratadine (CLARITIN) 10 MG tablet Take 10 mg by mouth daily.  . Multiple Minerals-Vitamins (NUTRA-SUPPORT BONE) CAPS Take 1 capsule by mouth daily.  . vitamin B-12 (CYANOCOBALAMIN) 500 MCG tablet Take 1 tablet (500 mcg total) by mouth daily.    PHQ 2/9 Scores 05/07/2018 01/29/2017 01/02/2017 10/24/2016  PHQ - 2 Score 0 0 0 0  PHQ- 9 Score 0 - - -    BP Readings from Last 3 Encounters:  05/27/19 130/60  01/21/19 133/64  05/07/18 100/62    Physical Exam Vitals signs and nursing note reviewed.  Constitutional:      General: She is not in acute distress.    Appearance: She is not  diaphoretic.  HENT:     Head: Normocephalic and atraumatic.     Right Ear: Tympanic membrane, ear canal and external ear normal.     Left Ear: Tympanic membrane, ear canal and external ear normal.     Nose: Nose normal. No congestion.  Eyes:     General:        Right eye: No discharge.        Left eye: No discharge.     Conjunctiva/sclera: Conjunctivae normal.     Pupils: Pupils are equal, round, and reactive to light.  Neck:     Musculoskeletal: Normal range of motion and neck supple.     Thyroid: No thyromegaly.     Vascular: No JVD.  Cardiovascular:     Rate and Rhythm: Normal rate and regular rhythm.     Pulses:          Dorsalis pedis pulses are 2+ on the right side and 2+ on the left side.       Posterior tibial pulses are 2+ on the right side and 2+ on the left side.     Heart sounds: Normal heart sounds. No murmur. No friction rub. No gallop.   Pulmonary:     Effort: Pulmonary effort is normal.     Breath sounds: Normal breath sounds. No wheezing or rhonchi.  Abdominal:  General: Bowel sounds are normal.     Palpations: Abdomen is soft. There is no mass.     Tenderness: There is no abdominal tenderness. There is no guarding.  Musculoskeletal: Normal range of motion.  Feet:     Right foot:     Protective Sensation: 10 sites tested. 10 sites sensed.     Skin integrity: Skin integrity normal.     Left foot:     Protective Sensation: 10 sites tested. 10 sites sensed.     Skin integrity: Skin integrity normal.  Lymphadenopathy:     Cervical: No cervical adenopathy.  Skin:    General: Skin is warm and dry.  Neurological:     Mental Status: She is alert.     Deep Tendon Reflexes: Reflexes are normal and symmetric.     Wt Readings from Last 3 Encounters:  05/27/19 178 lb (80.7 kg)  01/21/19 181 lb 14.4 oz (82.5 kg)  05/07/18 177 lb (80.3 kg)    BP 130/60   Pulse 80   Ht 5\' 4"  (1.626 m)   Wt 178 lb (80.7 kg)   LMP 02/07/1991   BMI 30.55 kg/m   Assessment  and Plan: 1. Familial hypercholesterolemia This is diet controlled.  Reviewed previous is not been a significant elevation however we will do a lipid panel today to see if there is been any change. - Lipid panel  2. Prediabetes Looking at A1c's patient has had readings at the edge of prediabetes at 6.4.  We will repeat an A1c with hopes that this has not developed into full-blown diabetes.  We will also check a renal function panel microalbuminuria. - Hemoglobin A1c - Renal Function Panel - Microalbumin, urine  3. Obesity (BMI 30.0-34.9) Patient has a BMI in the obesity range and we have discussed that she needs to use lose weight but she has been given a diet to do so.  4. Cold intolerance Patient's been having some evidence of cold intolerance and with her recent weight concern we will check a TSH to rule out possibility of hypothyroid. - TSH  5. Need for 23-polyvalent pneumococcal polysaccharide vaccine Discussed and administered. - Pneumococcal polysaccharide vaccine 23-valent greater than or equal to 2yo subcutaneous/IM

## 2019-05-27 NOTE — Patient Instructions (Signed)
Carbohydrate Counting for Diabetes Mellitus, Adult  Carbohydrate counting is a method of keeping track of how many carbohydrates you eat. Eating carbohydrates naturally increases the amount of sugar (glucose) in the blood. Counting how many carbohydrates you eat helps keep your blood glucose within normal limits, which helps you manage your diabetes (diabetes mellitus). It is important to know how many carbohydrates you can safely have in each meal. This is different for every person. A diet and nutrition specialist (registered dietitian) can help you make a meal plan and calculate how many carbohydrates you should have at each meal and snack. Carbohydrates are found in the following foods:  Grains, such as breads and cereals.  Dried beans and soy products.  Starchy vegetables, such as potatoes, peas, and corn.  Fruit and fruit juices.  Milk and yogurt.  Sweets and snack foods, such as cake, cookies, candy, chips, and soft drinks. How do I count carbohydrates? There are two ways to count carbohydrates in food. You can use either of the methods or a combination of both. Reading "Nutrition Facts" on packaged food The "Nutrition Facts" list is included on the labels of almost all packaged foods and beverages in the [place] It includes:  The serving size.  Information about nutrients in each serving, including the grams (g) of carbohydrate per serving. To use the "Nutrition Facts":  Decide how many servings you will have.  Multiply the number of servings by the number of carbohydrates per serving.  The resulting number is the total amount of carbohydrates that you will be having. Learning standard serving sizes of other foods When you eat carbohydrate foods that are not packaged or do not include "Nutrition Facts" on the label, you need to measure the servings in order to count the amount of carbohydrates:  Measure the foods that you will eat with a food scale or measuring cup, if needed.   Decide how many standard-size servings you will eat.  Multiply the number of servings by 15. Most carbohydrate-rich foods have about 15 g of carbohydrates per serving. ? For example, if you eat 8 oz (170 g) of strawberries, you will have eaten 2 servings and 30 g of carbohydrates (2 servings x 15 g = 30 g).  For foods that have more than one food mixed, such as soups and casseroles, you must count the carbohydrates in each food that is included. The following list contains standard serving sizes of common carbohydrate-rich foods. Each of these servings has about 15 g of carbohydrates:   hamburger bun or  English muffin.   oz (15 mL) syrup.   oz (14 g) jelly.  1 slice of bread.  1 six-inch tortilla.  3 oz (85 g) cooked rice or pasta.  4 oz (113 g) cooked dried beans.  4 oz (113 g) starchy vegetable, such as peas, corn, or potatoes.  4 oz (113 g) hot cereal.  4 oz (113 g) mashed potatoes or  of a large baked potato.  4 oz (113 g) canned or frozen fruit.  4 oz (120 mL) fruit juice.  4-6 crackers.  6 chicken nuggets.  6 oz (170 g) unsweetened dry cereal.  6 oz (170 g) plain fat-free yogurt or yogurt sweetened with artificial sweeteners.  8 oz (240 mL) milk.  8 oz (170 g) fresh fruit or one small piece of fruit.  24 oz (680 g) popped popcorn. Example of carbohydrate counting Sample meal  3 oz (85 g) chicken breast.  6 oz (170 g)   brown rice.  4 oz (113 g) corn.  8 oz (240 mL) milk.  8 oz (170 g) strawberries with sugar-free whipped topping. Carbohydrate calculation 1. Identify the foods that contain carbohydrates: ? Rice. ? Corn. ? Milk. ? Strawberries. 2. Calculate how many servings you have of each food: ? 2 servings rice. ? 1 serving corn. ? 1 serving milk. ? 1 serving strawberries. 3. Multiply each number of servings by 15 g: ? 2 servings rice x 15 g = 30 g. ? 1 serving corn x 15 g = 15 g. ? 1 serving milk x 15 g = 15 g. ? 1 serving  strawberries x 15 g = 15 g. 4. Add together all of the amounts to find the total grams of carbohydrates eaten: ? 30 g + 15 g + 15 g + 15 g = 75 g of carbohydrates total. Summary  Carbohydrate counting is a method of keeping track of how many carbohydrates you eat.  Eating carbohydrates naturally increases the amount of sugar (glucose) in the blood.  Counting how many carbohydrates you eat helps keep your blood glucose within normal limits, which helps you manage your diabetes.  A diet and nutrition specialist (registered dietitian) can help you make a meal plan and calculate how many carbohydrates you should have at each meal and snack. This information is not intended to replace advice given to you by your health care provider. Make sure you discuss any questions you have with your health care provider. Document Released: 06/24/2005 Document Revised: 01/16/2017 Document Reviewed: 12/06/2015 Elsevier Patient Education  2020 [employer]  

## 2019-05-28 LAB — MICROALBUMIN, URINE: Microalbumin, Urine: <3 ug/mL

## 2019-05-28 LAB — RENAL FUNCTION PANEL
Albumin: 4.2 g/dL (ref 3.8–4.8)
BUN/Creatinine Ratio: 15 (ref 12–28)
BUN: 14 mg/dL (ref 8–27)
CO2: 24 mmol/L (ref 20–29)
Calcium: 9.5 mg/dL (ref 8.7–10.3)
Chloride: 101 mmol/L (ref 96–106)
Creatinine, Ser: 0.96 mg/dL (ref 0.57–1.00)
GFR calc Af Amer: 71 mL/min/{1.73_m2} (ref >59)
GFR calc non Af Amer: 61 mL/min/{1.73_m2} (ref >59)
Glucose: 103 mg/dL — ABNORMAL HIGH (ref 65–99)
Phosphorus: 3.2 mg/dL (ref 3.0–4.3)
Potassium: 4.6 mmol/L (ref 3.5–5.2)
Sodium: 139 mmol/L (ref 134–144)

## 2019-05-28 LAB — HEMOGLOBIN A1C
Est. average glucose Bld gHb Est-mCnc: 134 mg/dL
Hgb A1c MFr Bld: 6.3 % — ABNORMAL HIGH (ref 4.8–5.6)

## 2019-05-28 LAB — LIPID PANEL
Chol/HDL Ratio: 3.3 ratio (ref 0.0–4.4)
Cholesterol, Total: 226 mg/dL — ABNORMAL HIGH (ref 100–199)
HDL: 68 mg/dL (ref >39)
LDL Chol Calc (NIH): 143 mg/dL — ABNORMAL HIGH (ref 0–99)
Triglycerides: 88 mg/dL (ref 0–149)
VLDL Cholesterol Cal: 15 mg/dL (ref 5–40)

## 2019-05-28 LAB — TSH: TSH: 1.1 u[IU]/mL (ref 0.450–4.500)

## 2019-06-01 LAB — MICROALBUMIN, URINE: Microalb, Ur: 3

## 2019-06-20 LAB — HEMOGLOBIN A1C: Hemoglobin A1C: 6

## 2019-07-06 ENCOUNTER — Other Ambulatory Visit: Payer: Self-pay

## 2019-07-21 ENCOUNTER — Other Ambulatory Visit: Payer: Self-pay

## 2019-08-01 DIAGNOSIS — E119 Type 2 diabetes mellitus without complications: Secondary | ICD-10-CM | POA: Diagnosis not present

## 2019-08-04 DIAGNOSIS — Z881 Allergy status to other antibiotic agents status: Secondary | ICD-10-CM | POA: Diagnosis not present

## 2019-08-04 DIAGNOSIS — E669 Obesity, unspecified: Secondary | ICD-10-CM | POA: Diagnosis not present

## 2019-08-04 DIAGNOSIS — Z880 Allergy status to penicillin: Secondary | ICD-10-CM | POA: Diagnosis not present

## 2019-08-04 DIAGNOSIS — Z7982 Long term (current) use of aspirin: Secondary | ICD-10-CM | POA: Diagnosis not present

## 2019-08-09 ENCOUNTER — Other Ambulatory Visit: Payer: Self-pay | Admitting: Obstetrics and Gynecology

## 2019-08-09 DIAGNOSIS — Z87898 Personal history of other specified conditions: Secondary | ICD-10-CM

## 2019-09-23 ENCOUNTER — Other Ambulatory Visit: Payer: Self-pay | Admitting: Obstetrics and Gynecology

## 2019-09-23 ENCOUNTER — Ambulatory Visit
Admission: RE | Admit: 2019-09-23 | Discharge: 2019-09-23 | Disposition: A | Discharge status: Home / Self Care | Payer: Medicare HMO | Source: Ambulatory Visit | Attending: Obstetrics and Gynecology | Admitting: Obstetrics and Gynecology

## 2019-09-23 DIAGNOSIS — R922 Inconclusive mammogram: Secondary | ICD-10-CM | POA: Diagnosis not present

## 2019-09-23 DIAGNOSIS — R928 Other abnormal and inconclusive findings on diagnostic imaging of breast: Secondary | ICD-10-CM | POA: Diagnosis not present

## 2019-09-23 DIAGNOSIS — Z87898 Personal history of other specified conditions: Secondary | ICD-10-CM

## 2019-10-15 ENCOUNTER — Telehealth: Payer: Self-pay | Admitting: Family Medicine

## 2019-10-15 NOTE — Telephone Encounter (Signed)
Left message for patient to call back and schedule Medicare Annual Wellness Visit (AWV) either virtually/audio only or in office. Whichever the patients preference is.  No history of AWV; please schedule at anytime with [facility] Health Advisor.

## 2019-11-10 ENCOUNTER — Ambulatory Visit: Payer: Medicare Other | Admitting: Family Medicine

## 2019-11-17 ENCOUNTER — Ambulatory Visit (INDEPENDENT_AMBULATORY_CARE_PROVIDER_SITE_OTHER): Payer: Medicare PPO

## 2019-11-17 DIAGNOSIS — Z0000 Encounter for general adult medical examination without abnormal findings: Secondary | ICD-10-CM | POA: Diagnosis not present

## 2019-11-17 NOTE — Progress Notes (Signed)
Subjective:   Wendy Tucker is a 69 y.o. female who presents for an Initial Medicare Annual Wellness Visit.  Virtual Visit via Telephone Note  I connected with  Wendy Tucker on 11/17/19 at  3:20 PM EDT by telephone and verified that I am speaking with the correct person using two identifiers.  Medicare Annual Wellness visit completed telephonically due to Covid-19 pandemic.   Location: Patient: home Provider: office   I discussed the limitations, risks, security and privacy concerns of performing an evaluation and management service by telephone and the availability of in person appointments. The patient expressed understanding and agreed to proceed.  Unable to perform video visit due to patient does not have video capability.   Some vital signs may be absent or patient reported.   Clemetine Marker, LPN    Review of Systems      Cardiac Risk Factors include: advanced age (>59men, >32 women)     Objective:    There were no vitals filed for this visit. There is no height or weight on file to calculate BMI.  Advanced Directives 11/17/2019 01/29/2017 01/02/2017 11/28/2016 10/24/2016  Does Patient Have a Medical Advance Directive? Yes Yes Yes Yes Yes  Type of Paramedic of [place];Living will - - Healthcare Power of [place]  Does patient want to make changes to medical advance directive? - - - - No - Patient declined  Copy of [place] in Chart? No - copy requested - - Yes -    Current Medications (verified) Outpatient Encounter Medications as of 11/17/2019  Medication Sig  . aspirin 81 MG tablet Take 1 tablet (81 mg total) by mouth daily.  . cetirizine (ZYRTEC) 10 MG tablet Take 10 mg by mouth daily.  . cholecalciferol (VITAMIN D) 1000 units tablet Take 1,000 Units by mouth daily.  . Multiple Minerals-Vitamins (NUTRA-SUPPORT BONE) CAPS Take 1 capsule by mouth daily.  . vitamin B-12 (CYANOCOBALAMIN) 500  MCG tablet Take 1 tablet (500 mcg total) by mouth daily.  . [DISCONTINUED] loratadine (CLARITIN) 10 MG tablet Take 10 mg by mouth daily.   No facility-administered encounter medications on file as of 11/17/2019.    Allergies (verified) Cephalexin and Amoxicillin   History: Past Medical History:  Diagnosis Date  . Eye infection, right   . Vertigo    x1, over 20 yrs ago   Past Surgical History:  Procedure Laterality Date  . BREAST BIOPSY Bilateral    neg- needle bx/clips  . BREAST BIOPSY Right 03/18/2019   affirm bx of calcs medial, coil clip, negative  . COLONOSCOPY WITH PROPOFOL N/A 11/28/2016   Procedure: COLONOSCOPY WITH PROPOFOL;  Surgeon: Lucilla Lame, MD;  Location: [facility];  Service: Endoscopy;  Laterality: N/A;  . ECTOPIC PREGNANCY SURGERY    . fractured leg Right 2008   has metal rods  . POLYPECTOMY  11/28/2016   Procedure: POLYPECTOMY;  Surgeon: Lucilla Lame, MD;  Location: [place];  Service: Endoscopy;;   Family History  Problem Relation Age of Onset  . Hypertension Mother   . Stomach cancer Father   . Breast cancer Sister 63  . Bone cancer Sister    Social History   Socioeconomic History  . Marital status: Single    Spouse name: Not on file  . Number of children: 0  . Years of education: Not on file  . Highest education level: Not on file  Occupational History  . Not on file  Tobacco Use  . Smoking status: Never Smoker  . Smokeless tobacco: Never Used  Substance and Sexual Activity  . Alcohol use: Yes    Comment: Occasionally, 2 glass wine/month  . Drug use: No  . Sexual activity: Yes    Birth control/protection: Post-menopausal  Other Topics Concern  . Not on file  Social History Narrative  . Not on file   Social Determinants of Health   Financial Resource Strain: Low Risk   . Difficulty of Paying Living Expenses: Not hard at all  Food Insecurity: No Food Insecurity  . Worried About Charity fundraiser in the Last  Year: Never true  . Ran Out of Food in the Last Year: Never true  Transportation Needs: No Transportation Needs  . Lack of Transportation (Medical): No  . Lack of Transportation (Non-Medical): No  Physical Activity: Sufficiently Active  . Days of Exercise per Week: 7 days  . Minutes of Exercise per Session: 60 min  Stress: No Stress Concern Present  . Feeling of Stress : Not at all  Social Connections: Somewhat Isolated  . Frequency of Communication with Friends and Family: More than three times a week  . Frequency of Social Gatherings with Friends and Family: More than three times a week  . Attends Religious Services: More than 4 times per year  . Active Member of Clubs or Organizations: No  . Attends Archivist Meetings: Never  . Marital Status: Divorced    Tobacco Counseling Counseling given: Not Answered   Clinical Intake:  Pre-visit preparation completed: Yes  Pain : No/denies pain     Nutritional Risks: None Diabetes: Yes CBG done?: No Did pt. bring in CBG monitor from home?: No  How often do you need to have someone help you when you read instructions, pamphlets, or other written materials from your doctor or pharmacy?: 1 - Never  Interpreter Needed?: No  Information entered by :: Clemetine Marker LPN   Activities of Daily Living In your present state of health, do you have any difficulty performing the following activities: 11/17/2019  Hearing? N  Comment declines hearing aids  Vision? N  Difficulty concentrating or making decisions? N  Walking or climbing stairs? N  Dressing or bathing? N  Doing errands, shopping? N  Preparing Food and eating ? N  Using the Toilet? N  In the past six months, have you accidently leaked urine? N  Do you have problems with loss of bowel control? N  Managing your Medications? N  Managing your Finances? N  Housekeeping or managing your Housekeeping? N  Some recent data might be hidden     Immunizations and Health  Maintenance Immunization History  Administered Date(s) Administered  . Fluad Quad(high Dose 65+) 03/25/2019  . Moderna SARS-COVID-2 Vaccination 07/11/2019, 08/08/2019  . Pneumococcal Conjugate-13 10/24/2016  . Pneumococcal Polysaccharide-23 05/27/2019  . Tdap 10/24/2016   There are no preventive care reminders to display for this patient.  Patient Care Team: Juline Patch, MD as PCP - General (Family Medicine)  Indicate any recent Medical Services you may have received from other than [facility] providers in the past year (date may be approximate).     Assessment:   This is a routine wellness examination for Deshanta.  Hearing/Vision screen  Hearing Screening   125Hz  250Hz  500Hz  1000Hz  2000Hz  3000Hz  4000Hz  6000Hz  8000Hz   Right ear:           Left ear:  Comments: Pt denies hearing difficulty  Vision Screening Comments: Annual vision screenings [facility] Dr[facility] Wallace [facility] at [facility]  Dietary issues and exercise activities discussed: Current Exercise Habits: Home exercise routine, Type of exercise: treadmill;Other - see comments(exercise bike), Time (Minutes): 60, Frequency (Times/Week): 7, Weekly Exercise (Minutes/Week): 420, Intensity: Moderate, Exercise limited by: None identified  Goals   None    Depression Screen PHQ 2/9 Scores 11/17/2019 05/07/2018 01/29/2017 01/02/2017 10/24/2016  PHQ - 2 Score 0 0 0 0 0  PHQ- 9 Score - 0 - - -    Fall Risk Fall Risk  11/17/2019 05/07/2018 01/29/2017 01/02/2017 10/24/2016  Falls in the past year? 0 No No No No  Number falls in past yr: 0 - - - -  Injury with Fall? 0 - - - -  Risk for fall due to : No Fall Risks - - - -  Follow up Falls prevention discussed - - - -    FALL RISK PREVENTION PERTAINING TO THE HOME:  Any stairs in or around the home? Yes  If so, do they handrails? Yes   Home free of loose throw rugs in walkways, pet beds, electrical cords, etc? Yes  Adequate lighting in your home to reduce risk of falls? Yes     ASSISTIVE DEVICES UTILIZED TO PREVENT FALLS:  Life alert? No  Use of a cane, walker or w/c? No  Grab bars in the bathroom? Yes  Shower chair or bench in shower? Yes  Elevated toilet seat or a handicapped toilet? No   DME ORDERS:  DME order needed?  No   TIMED UP AND GO:  Was the test performed? No . Telephonic visit.   Education: Fall risk prevention has been discussed.  Intervention(s) required? No   Cognitive Function:     6CIT Screen 11/17/2019  What Year? 0 points  What month? 0 points  What time? 0 points  Count back from 20 0 points  Months in reverse 0 points  Repeat phrase 0 points  Total Score 0    Screening Tests Health Maintenance  Topic Date Due  . Hepatitis C Screening  05/26/2020 (Originally 24-Mar-1951)  . HEMOGLOBIN A1C  12/19/2019  . OPHTHALMOLOGY EXAM  01/19/2020  . INFLUENZA VACCINE  02/06/2020  . MAMMOGRAM  02/12/2020  . FOOT EXAM  05/26/2020  . URINE MICROALBUMIN  05/31/2020  . COLONOSCOPY  11/28/2021  . TETANUS/TDAP  10/25/2026  . DEXA SCAN  Completed  . COVID-19 Vaccine  Completed  . PNA vac Low Risk Adult  Completed    Qualifies for Shingles Vaccine? Yes . Due for Shingrix. Education has been provided regarding the importance of this vaccine. Pt has been advised to call insurance company to determine out of pocket expense. Advised may also receive vaccine at local pharmacy or Health Dept. Verbalized acceptance and understanding.  Tdap: Up to date  Flu Vaccine: Up to date  Pneumococcal Vaccine: Up to date  Covid-19 Vaccine: Due for Covid-19 vaccine. Education has been provided regarding the importance of this vaccine. Advised may receive this vaccine at local pharmacy, health department or [facility] vaccine clinic. Aware to provide a copy of the vaccination record if obtained from local pharmacy or health department. Verbalized acceptance and understanding.    Cancer Screenings:  Colorectal Screening: Completed 11/28/16. Repeat  every 5 years;   Mammogram: Completed 02/12/19. Repeat every year. Ordered today. Pt provided with contact information and advised to call to schedule appt.   Bone Density: Completed 05/01/16.  Results reflect  OSTEOPENIA. Repeat every 2 years. Pt declines repeat screening at this time.   Lung Cancer Screening: (Low Dose CT Chest recommended if Age 69-80 years, 30 pack-year currently smoking OR have quit w/in 15years.) does not qualify.   Additional Screening:  Hepatitis C Screening: does qualify; postponed.  Vision Screening: Recommended annual ophthalmology exams for early detection of glaucoma and other disorders of the eye. Is the patient up to date with their annual eye exam?  Yes  Who is the provider or what is the name of the office in which the pt attends annual eye exams? [place] Screening: Recommended annual dental exams for proper oral hygiene  Community Resource Referral:  CRR required this visit?  No     Plan:    I have personally reviewed and addressed the Medicare Annual Wellness questionnaire and have noted the following in the patient's chart:  A. Medical and social history B. Use of alcohol, tobacco or illicit drugs  C. Current medications and supplements D. Functional ability and status E.  Nutritional status F.  Physical activity G. Advance directives H. List of other physicians I.  Hospitalizations, surgeries, and ER visits in previous 12 months J.  [place] such as hearing and vision if needed, cognitive and depression L. Referrals and appointments   In addition, I have reviewed and discussed with patient certain preventive protocols, quality metrics, and best practice recommendations. A written personalized care plan for preventive services as well as general preventive health recommendations were provided to patient.   Signed,  Clemetine Marker, LPN Nurse Health Advisor   Nurse Notes: none

## 2019-11-17 NOTE — Patient Instructions (Signed)
Wendy Tucker , Thank you for taking time to come for your Medicare Wellness Visit. I appreciate your ongoing commitment to your health goals. Please review the following plan we discussed and let me know if I can assist you in the future.   Screening recommendations/referrals: Colonoscopy: done 11/28/16. Repeat in 2023. Mammogram: done 02/12/19. Please call [phone] to schedule your mammogram.  Bone Density: done 05/01/16 Recommended yearly ophthalmology/optometry visit for glaucoma screening and checkup Recommended yearly dental visit for hygiene and checkup  Vaccinations: Influenza vaccine: done 03/25/19 Pneumococcal vaccine: done 05/27/19 Tdap vaccine: done 10/24/16 Shingles vaccine: Shingrix discussed. Please contact your pharmacy for coverage information.  Covid-19: done 07/11/19 & 08/08/19   Advanced directives: Please bring a copy of your health care power of attorney and living will to the office at your convenience.  Conditions/risks identified: Keep up the great work!  Next appointment: Please follow up in one year for your Medicare Annual Wellness visit.     Preventive Care 69 Years and Older, Female Preventive care refers to lifestyle choices and visits with your health care provider that can promote health and wellness. What does preventive care include?  A yearly physical exam at 69. This is also called an annual well check.  Dental exams once or twice a year.  Routine eye exams. Ask your health care provider how often you should have your eyes checked.  Personal lifestyle choices, including:  Daily care of your teeth and gums.  Regular physical activity.  Eating a healthy diet.  Avoiding tobacco and drug use.  Limiting alcohol use.  Practicing safe sex.  Taking low-dose aspirin every day.  Taking vitamin and mineral supplements as recommended by your health care provider. What happens during an annual well check? The services and screenings done by your health  care provider during your annual well check will depend on your age, overall health, lifestyle risk factors, and family history of disease. Counseling  Your health care provider may ask you questions about your:  Alcohol use.  Tobacco use.  Drug use.  Emotional well-being.  Home and relationship well-being.  Sexual activity.  Eating habits.  History of falls.  Memory and ability to understand (cognition).  Work and work Statistician.  Reproductive health. Screening  You may have the following tests or measurements:  Height, weight, and BMI.  Blood pressure.  Lipid and cholesterol levels. These may be checked every 5 years, or more frequently if you are over 44 years old.  Skin check.  Lung cancer screening. You may have this screening every year starting at age 69 if you have a 30-pack-year history of smoking and currently smoke or have quit within the past 15 years.  Fecal occult blood test (FOBT) of the stool. You may have this test every year starting at age 69.  Flexible sigmoidoscopy or colonoscopy. You may have a sigmoidoscopy every 5 years or a colonoscopy every 10 years starting at age 69.  Hepatitis C blood test.  Hepatitis B blood test.  Sexually transmitted disease (STD) testing.  Diabetes screening. This is done by checking your blood sugar (glucose) after you have not eaten for a while (fasting). You may have this done every 1-3 years.  Bone density scan. This is done to screen for osteoporosis. You may have this done starting at age 69.  Mammogram. This may be done every 1-2 years. Talk to your health care provider about how often you should have regular mammograms. Talk with your health care provider about your test  results, treatment options, and if necessary, the need for more tests. Vaccines  Your health care provider may recommend certain vaccines, such as:  Influenza vaccine. This is recommended every 69 years.  Tetanus, diphtheria, and  acellular pertussis (Tdap, Td) vaccine. You may need a Td booster every 10 years.  Zoster vaccine. You may need this after age 69.  Pneumococcal 13-valent conjugate (PCV13) vaccine. One dose is recommended after age 69.  Pneumococcal polysaccharide (PPSV23) vaccine. One dose is recommended after age 69. Talk to your health care provider about which screenings and vaccines you need and how often you need them. This information is not intended to replace advice given to you by your health care provider. Make sure you discuss any questions you have with your health care provider. Document Released: 07/21/2015 Document Revised: 03/13/2016 Document Reviewed: 04/25/2015 Elsevier Interactive Patient Education  2017 [employer] Prevention in the Home Falls can cause injuries. They can happen to people of all ages. There are many things you can do to make your home safe and to help prevent falls. What can I do on the outside of my home?  Regularly fix the edges of walkways and driveways and fix any cracks.  Remove anything that might make you trip as you walk through a door, such as a raised step or threshold.  Trim any bushes or trees on the path to your home.  Use bright outdoor lighting.  Clear any walking paths of anything that might make someone trip, such as rocks or tools.  Regularly check to see if handrails are loose or broken. Make sure that both sides of any steps have handrails.  Any raised decks and porches should have guardrails on the edges.  Have any leaves, snow, or ice cleared regularly.  Use sand or salt on walking paths during winter.  Clean up any spills in your garage right away. This includes oil or grease spills. What can I do in the bathroom?  Use night lights.  Install grab bars by the toilet and in the tub and shower. Do not use towel bars as grab bars.  Use non-skid mats or decals in the tub or shower.  If you need to sit down in the shower, use  a plastic, non-slip stool.  Keep the floor dry. Clean up any water that spills on the floor as soon as it happens.  Remove soap buildup in the tub or shower regularly.  Attach bath mats securely with double-sided non-slip rug tape.  Do not have throw rugs and other things on the floor that can make you trip. What can I do in the bedroom?  Use night lights.  Make sure that you have a light by your bed that is easy to reach.  Do not use any sheets or blankets that are too big for your bed. They should not hang down onto the floor.  Have a firm chair that has side arms. You can use this for support while you get dressed.  Do not have throw rugs and other things on the floor that can make you trip. What can I do in the kitchen?  Clean up any spills right away.  Avoid walking on wet floors.  Keep items that you use a lot in easy-to-reach places.  If you need to reach something above you, use a strong step stool that has a grab bar.  Keep electrical cords out of the way.  Do not use floor polish or wax that makes  floors slippery. If you must use wax, use non-skid floor wax.  Do not have throw rugs and other things on the floor that can make you trip. What can I do with my stairs?  Do not leave any items on the stairs.  Make sure that there are handrails on both sides of the stairs and use them. Fix handrails that are broken or loose. Make sure that handrails are as long as the stairways.  Check any carpeting to make sure that it is firmly attached to the stairs. Fix any carpet that is loose or worn.  Avoid having throw rugs at the top or bottom of the stairs. If you do have throw rugs, attach them to the floor with carpet tape.  Make sure that you have a light switch at the top of the stairs and the bottom of the stairs. If you do not have them, ask someone to add them for you. What else can I do to help prevent falls?  Wear shoes that:  Do not have high heels.  Have  rubber bottoms.  Are comfortable and fit you well.  Are closed at the toe. Do not wear sandals.  If you use a stepladder:  Make sure that it is fully opened. Do not climb a closed stepladder.  Make sure that both sides of the stepladder are locked into place.  Ask someone to hold it for you, if possible.  Clearly mark and make sure that you can see:  Any grab bars or handrails.  First and last steps.  Where the edge of each step is.  Use tools that help you move around (mobility aids) if they are needed. These include:  Canes.  Walkers.  Scooters.  Crutches.  Turn on the lights when you go into a dark area. Replace any light bulbs as soon as they burn out.  Set up your furniture so you have a clear path. Avoid moving your furniture around.  If any of your floors are uneven, fix them.  If there are any pets around you, be aware of where they are.  Review your medicines with your doctor. Some medicines can make you feel dizzy. This can increase your chance of falling. Ask your doctor what other things that you can do to help prevent falls. This information is not intended to replace advice given to you by your health care provider. Make sure you discuss any questions you have with your health care provider. Document Released: 04/20/2009 Document Revised: 11/30/2015 Document Reviewed: 07/29/2014 Elsevier Interactive Patient Education  2017 [employer].

## 2019-11-18 ENCOUNTER — Ambulatory Visit: Payer: Medicare PPO | Admitting: Family Medicine

## 2019-11-18 ENCOUNTER — Encounter: Payer: Self-pay | Admitting: Family Medicine

## 2019-11-18 ENCOUNTER — Other Ambulatory Visit: Payer: Self-pay

## 2019-11-18 VITALS — BP 120/62 | HR 64 | Ht 64.0 in | Wt 171.0 lb

## 2019-11-18 DIAGNOSIS — E663 Overweight: Secondary | ICD-10-CM

## 2019-11-18 DIAGNOSIS — E7801 Familial hypercholesterolemia: Secondary | ICD-10-CM | POA: Diagnosis not present

## 2019-11-18 DIAGNOSIS — R7303 Prediabetes: Secondary | ICD-10-CM

## 2019-11-18 NOTE — Patient Instructions (Signed)

## 2019-11-18 NOTE — Progress Notes (Signed)
Date:  11/18/2019   Name:  Wendy Tucker   DOB:  1950/11/03   MRN:  [id number]   Chief Complaint: Prediabetes (no meds for either of these) and Hyperlipidemia  Hyperlipidemia This is a chronic problem. The current episode started more than 1 year ago. The problem is controlled. Recent lipid tests were reviewed and are normal. Exacerbating diseases include diabetes and obesity. She has no history of chronic renal disease, hypothyroidism, liver disease or nephrotic syndrome. Pertinent negatives include no chest pain, focal sensory loss, focal weakness, leg pain, myalgias or shortness of breath. She is currently on no antihyperlipidemic treatment. The current treatment provides moderate improvement of lipids. There are no compliance problems.  There are no known risk factors for coronary artery disease.  Diabetes She presents for her follow-up diabetic visit. Diabetes type: prediabetes. There are no hypoglycemic associated symptoms. Pertinent negatives for hypoglycemia include no dizziness, headaches or nervousness/anxiousness. Associated symptoms include weight loss. Pertinent negatives for diabetes include no blurred vision, no chest pain, no fatigue, no foot paresthesias, no foot ulcerations, no polydipsia, no polyphagia, no polyuria, no visual change and no weakness. There are no hypoglycemic complications. Symptoms are stable. There are no diabetic complications. There are no known risk factors for coronary artery disease. Current diabetic treatment includes diet.    Lab Results  Component Value Date   CREATININE 0.96 05/27/2019   BUN 14 05/27/2019   NA 139 05/27/2019   K 4.6 05/27/2019   CL 101 05/27/2019   CO2 24 05/27/2019   Lab Results  Component Value Date   CHOL 226 (H) 05/27/2019   HDL 68 05/27/2019   LDLCALC 143 (H) 05/27/2019   TRIG 88 05/27/2019   CHOLHDL 3.3 05/27/2019   Lab Results  Component Value Date   TSH 1.100 05/27/2019   Lab Results  Component Value Date    HGBA1C 6.0 06/20/2019   Lab Results  Component Value Date   WBC 8.4 02/07/2016   HGB 12.9 02/07/2016   HCT 39.0 02/07/2016   MCV 91 02/07/2016   PLT 206 02/07/2016   Lab Results  Component Value Date   ALT 13 02/07/2016   AST 20 02/07/2016   ALKPHOS 80 02/07/2016   BILITOT 0.3 02/07/2016     Review of Systems  Constitutional: Positive for weight loss. Negative for chills, fatigue and fever.  HENT: Negative for drooling, ear discharge, ear pain and sore throat.   Eyes: Negative for blurred vision.  Respiratory: Negative for cough, shortness of breath and wheezing.   Cardiovascular: Negative for chest pain, palpitations and leg swelling.  Gastrointestinal: Negative for abdominal pain, blood in stool, constipation, diarrhea and nausea.  Endocrine: Negative for polydipsia, polyphagia and polyuria.  Genitourinary: Negative for dysuria, frequency, hematuria and urgency.  Musculoskeletal: Negative for back pain, myalgias and neck pain.  Skin: Negative for rash.  Allergic/Immunologic: Negative for environmental allergies.  Neurological: Negative for dizziness, focal weakness, weakness and headaches.  Hematological: Does not bruise/bleed easily.  Psychiatric/Behavioral: Negative for suicidal ideas. The patient is not nervous/anxious.     Patient Active Problem List   Diagnosis Date Noted  . Special screening for malignant neoplasms, colon   . Polyp of sigmoid colon   . Benign neoplasm of descending colon   . Hyperlipidemia 11/21/2016  . Vitamin D deficiency 10/24/2016  . Osteopenia 10/24/2016  . Diabetes mellitus type 2, controlled, without complications ([place]) [id number]  . B12 deficiency 10/24/2016  . Obesity (BMI 30.0-34.9) 10/24/2016  . FH: Alzheimer's  disease 10/24/2016  . FH: gastric cancer 10/24/2016  . FH: breast cancer 10/24/2016    Allergies  Allergen Reactions  . Cephalexin   . Amoxicillin Rash    Past Surgical History:  Procedure Laterality Date  .  BREAST BIOPSY Bilateral    neg- needle bx/clips  . BREAST BIOPSY Right 03/18/2019   affirm bx of calcs medial, coil clip, negative  . COLONOSCOPY WITH PROPOFOL N/A 11/28/2016   Procedure: COLONOSCOPY WITH PROPOFOL;  Surgeon: Lucilla Lame, MD;  Location: [facility];  Service: Endoscopy;  Laterality: N/A;  . ECTOPIC PREGNANCY SURGERY    . fractured leg Right 2008   has metal rods  . POLYPECTOMY  11/28/2016   Procedure: POLYPECTOMY;  Surgeon: Lucilla Lame, MD;  Location: [facility];  Service: Endoscopy;;    Social History   Tobacco Use  . Smoking status: Never Smoker  . Smokeless tobacco: Never Used  Substance Use Topics  . Alcohol use: Yes    Comment: Occasionally, 2 glass wine/month  . Drug use: No     Medication list has been reviewed and updated.  Current Meds  Medication Sig  . aspirin 81 MG tablet Take 1 tablet (81 mg total) by mouth daily.  . cetirizine (ZYRTEC) 10 MG tablet Take 10 mg by mouth daily.  . cholecalciferol (VITAMIN D) 1000 units tablet Take 1,000 Units by mouth daily.  . Multiple Minerals-Vitamins (NUTRA-SUPPORT BONE) CAPS Take 1 capsule by mouth daily.  . vitamin B-12 (CYANOCOBALAMIN) 500 MCG tablet Take 1 tablet (500 mcg total) by mouth daily.    PHQ 2/9 Scores 11/18/2019 11/17/2019 05/07/2018 01/29/2017  PHQ - 2 Score 0 0 0 0  PHQ- 9 Score 0 - 0 -    BP Readings from Last 3 Encounters:  11/18/19 120/62  05/27/19 130/60  01/21/19 133/64    Physical Exam  Wt Readings from Last 3 Encounters:  11/18/19 171 lb (77.6 kg)  05/27/19 178 lb (80.7 kg)  01/21/19 181 lb 14.4 oz (82.5 kg)    BP 120/62   Pulse 64   Ht 5\' 4"  (1.626 m)   Wt 171 lb (77.6 kg)   LMP 02/07/1991   BMI 29.35 kg/m   Assessment and Plan:  1. Prediabetes Chronic.  Persistent.  Relatively stable at A1c of 6.3-6.4.  We have discussed what diabetes and prediabetes are and that patient is at the edge and has remained persistently there.  It is somewhat concerning  that she has had weight loss over the last 6 months without a regimented diet.  We will check an A1c and renal function panel to assess glucose and also will check a lipid panel to assess the patient's elevated LDLs in the past. - Hemoglobin A1c - Lipid Panel With LDL/HDL Ratio - Renal Function Panel  2. Familial hypercholesterolemia Chronic.  Controlled.  Stable.  Patient has been controlling this with dietary but we will take a look at a lipid panel with ratio and determine at this point if we need to consider other measures other than diet in the meantime patient has been given a low-cholesterol low glycemic diet. - Lipid Panel With LDL/HDL Ratio  3. Overweight (BMI 25.0-29.9) Health risks of being over weight were discussed and patient was counseled on weight loss options and exercise.

## 2019-11-19 LAB — LIPID PANEL WITH LDL/HDL RATIO
Cholesterol, Total: 232 mg/dL — ABNORMAL HIGH (ref 100–199)
HDL: 75 mg/dL (ref >39)
LDL Chol Calc (NIH): 142 mg/dL — ABNORMAL HIGH (ref 0–99)
LDL/HDL Ratio: 1.9 ratio (ref 0.0–3.2)
Triglycerides: 88 mg/dL (ref 0–149)
VLDL Cholesterol Cal: 15 mg/dL (ref 5–40)

## 2019-11-19 LAB — HEMOGLOBIN A1C
Est. average glucose Bld gHb Est-mCnc: 131 mg/dL
Hgb A1c MFr Bld: 6.2 % — ABNORMAL HIGH (ref 4.8–5.6)

## 2019-11-19 LAB — RENAL FUNCTION PANEL
Albumin: 4.3 g/dL (ref 3.8–4.8)
BUN/Creatinine Ratio: 14 (ref 12–28)
BUN: 13 mg/dL (ref 8–27)
CO2: 25 mmol/L (ref 20–29)
Calcium: 9.8 mg/dL (ref 8.7–10.3)
Chloride: 103 mmol/L (ref 96–106)
Creatinine, Ser: 0.94 mg/dL (ref 0.57–1.00)
GFR calc Af Amer: 72 mL/min/{1.73_m2} (ref >59)
GFR calc non Af Amer: 63 mL/min/{1.73_m2} (ref >59)
Glucose: 114 mg/dL — ABNORMAL HIGH (ref 65–99)
Phosphorus: 3.1 mg/dL (ref 3.0–4.3)
Potassium: 4.6 mmol/L (ref 3.5–5.2)
Sodium: 139 mmol/L (ref 134–144)

## 2019-11-22 ENCOUNTER — Other Ambulatory Visit: Payer: Self-pay

## 2019-11-22 DIAGNOSIS — E7801 Familial hypercholesterolemia: Secondary | ICD-10-CM

## 2019-11-22 MED ORDER — ATORVASTATIN CALCIUM 10 MG PO TABS: 10.0000 mg | ORAL_TABLET | Freq: Every day | ORAL | 1 refills | Status: DC

## 2019-11-22 NOTE — Progress Notes (Unsigned)
Sent in [place] to [facility]

## 2020-01-26 NOTE — Progress Notes (Signed)
Pt present for annual exam. Pt stated that she is doing well no problems.  

## 2020-01-26 NOTE — Patient Instructions (Signed)

## 2020-01-27 ENCOUNTER — Encounter: Payer: Self-pay | Admitting: Obstetrics and Gynecology

## 2020-01-27 ENCOUNTER — Ambulatory Visit (INDEPENDENT_AMBULATORY_CARE_PROVIDER_SITE_OTHER): Payer: Medicare PPO | Admitting: Obstetrics and Gynecology

## 2020-01-27 ENCOUNTER — Other Ambulatory Visit: Payer: Self-pay

## 2020-01-27 VITALS — BP 117/64 | HR 60 | Ht 64.0 in | Wt 173.3 lb

## 2020-01-27 DIAGNOSIS — Z87898 Personal history of other specified conditions: Secondary | ICD-10-CM

## 2020-01-27 DIAGNOSIS — E119 Type 2 diabetes mellitus without complications: Secondary | ICD-10-CM | POA: Diagnosis not present

## 2020-01-27 DIAGNOSIS — Z803 Family history of malignant neoplasm of breast: Secondary | ICD-10-CM | POA: Diagnosis not present

## 2020-01-27 DIAGNOSIS — Z01419 Encounter for gynecological examination (general) (routine) without abnormal findings: Secondary | ICD-10-CM

## 2020-01-27 DIAGNOSIS — M85859 Other specified disorders of bone density and structure, unspecified thigh: Secondary | ICD-10-CM | POA: Diagnosis not present

## 2020-01-27 DIAGNOSIS — Z1231 Encounter for screening mammogram for malignant neoplasm of breast: Secondary | ICD-10-CM | POA: Diagnosis not present

## 2020-01-27 NOTE — Progress Notes (Signed)
ANNUAL PREVENTATIVE CARE GYNECOLOGY  ENCOUNTER NOTE  Subjective:       Wendy Tucker is a 69 y.o. G1P0010 single post-menopausal female here for a routine annual gynecologic exam. The patient is sexually active. The patient is not taking hormone replacement therapy. Patient denies post-menopausal vaginal bleeding. The patient wears seatbelts: yes. The patient participates in regular exercise: yes (treadmill twice daily). Has the patient ever been transfused or tattooed?: no. The patient reports that there is not domestic violence in her life.   Patient is overall doing well and has no questions or concerns at today's visit.     Gynecologic History Patient's last menstrual period was 02/07/1991.  Patient is post-menopausal.  Contraception: post menopausal status Last Pap: 01/2019.  Results were: normal. No history of abnormal pap smears in the past. Last mammogram: 02/01/2019. Results were: normal. Order placed for repeat. Last Colonoscopy: 11/28/2016.  Results were: Benign polyps, diverticulosis, and internal hemorrhoids. Can repeat in 10 years.  Last Dexa Scan: 04/2016.  Results were osteopenia (T score -1.4).    Obstetric History OB History  Gravida Para Term Preterm AB Living  1       1    SAB TAB Ectopic Multiple Live Births      1        # Outcome Date GA Lbr Len/2nd Weight Sex Delivery Anes PTL Lv  1 Ectopic             Past Medical History:  Diagnosis Date   Eye infection, right    Vertigo    x1, over 20 yrs ago    Family History  Problem Relation Age of Onset   Hypertension Mother    Stomach cancer Father    Breast cancer Sister 19   Bone cancer Sister     Past Surgical History:  Procedure Laterality Date   BREAST BIOPSY Bilateral    neg- needle bx/clips   BREAST BIOPSY Right 03/18/2019   affirm bx of calcs medial, coil clip, negative   COLONOSCOPY WITH PROPOFOL N/A 11/28/2016   Procedure: COLONOSCOPY WITH PROPOFOL;  Surgeon: Lucilla Lame, MD;   Location: [facility];  Service: Endoscopy;  Laterality: N/A;   ECTOPIC PREGNANCY SURGERY     fractured leg Right 2008   has metal rods   POLYPECTOMY  11/28/2016   Procedure: POLYPECTOMY;  Surgeon: Lucilla Lame, MD;  Location: [facility];  Service: Endoscopy;;    Social History   Socioeconomic History   Marital status: Single    Spouse name: Not on file   Number of children: 0   Years of education: Not on file   Highest education level: Not on file  Occupational History   Not on file  Tobacco Use   Smoking status: Never Smoker   Smokeless tobacco: Never Used  Vaping Use   Vaping Use: Never used  Substance and Sexual Activity   Alcohol use: Yes    Comment: Occasionally, 2 glass wine/month   Drug use: No   Sexual activity: Yes    Birth control/protection: Post-menopausal  Other Topics Concern   Not on file  Social History Narrative   Not on file   Social Determinants of Health   Financial Resource Strain: Low Risk    Difficulty of Paying Living Expenses: Not hard at all  Food Insecurity: No Food Insecurity   Worried About Charity fundraiser in the Last Year: Never true   [place] in the Last Year: Never  true  Transportation Needs: No Transportation Needs   Lack of Transportation (Medical): No   Lack of Transportation (Non-Medical): No  Physical Activity: Sufficiently Active   Days of Exercise per Week: 7 days   Minutes of Exercise per Session: 60 min  Stress: No Stress Concern Present   Feeling of Stress : Not at all  Social Connections: Moderately Isolated   Frequency of Communication with Friends and Family: More than three times a week   Frequency of Social Gatherings with Friends and Family: More than three times a week   Attends Religious Services: More than 4 times per year   Active Member of [employer] or Organizations: No   Attends Archivist Meetings: Never   Marital Status: Divorced  Arboriculturist Violence: Not At Risk   Fear of Current or Ex-Partner: No   Emotionally Abused: No   Physically Abused: No   Sexually Abused: No    Current Outpatient Medications on File Prior to Visit  Medication Sig Dispense Refill   aspirin 81 MG tablet Take 1 tablet (81 mg total) by mouth daily. 30 tablet    atorvastatin (LIPITOR) 10 MG tablet Take 1 tablet (10 mg total) by mouth daily. 30 tablet 1   cetirizine (ZYRTEC) 10 MG tablet Take 10 mg by mouth daily.     cholecalciferol (VITAMIN D) 1000 units tablet Take 1,000 Units by mouth daily.     Multiple Minerals-Vitamins (NUTRA-SUPPORT BONE) CAPS Take 1 capsule by mouth daily.     vitamin B-12 (CYANOCOBALAMIN) 500 MCG tablet Take 1 tablet (500 mcg total) by mouth daily.     No current facility-administered medications on file prior to visit.    Allergies  Allergen Reactions   Cephalexin    Amoxicillin Rash     Review of Systems ROS General ROS:-  negative for - chills, fatigue, fever, hot flashes, night sweats, weight gain.   Psychological ROS: negative for - anxiety, decreased libido, depression, mood swings, physical abuse or sexual abuse Ophthalmic ROS: negative for - blurry vision, eye pain or loss of vision ENT ROS: negative for - headaches, hearing change, visual changes or vocal changes Allergy and Immunology ROS: negative for - hives, itchy/watery eyes or seasonal allergies Hematological and Lymphatic ROS: negative for - bleeding problems, bruising, swollen lymph nodes or weight loss Endocrine ROS: negative for - galactorrhea, hair pattern changes, hot flashes, malaise/lethargy, mood swings, palpitations, polydipsia/polyuria, skin changes, temperature intolerance or unexpected weight changes Breast ROS: negative for - new or changing breast lumps or nipple discharge Respiratory ROS: negative for - cough or shortness of breath Cardiovascular ROS: negative for - chest pain, irregular heartbeat, palpitations or  shortness of breath Gastrointestinal ROS: no abdominal pain, change in bowel habits, or black or bloody stools Genito-Urinary ROS: no dysuria, trouble voiding, or hematuria Musculoskeletal ROS: negative for - joint pain or joint stiffness Neurological ROS: negative for - bowel and bladder control changes Dermatological ROS: negative for rash and skin lesion changes   Objective:   BP 117/64    Pulse 60    Ht 5\' 4"  (1.626 m)    Wt 173 lb 4.8 oz (78.6 kg)    LMP 02/07/1991    BMI 29.75 kg/m  CONSTITUTIONAL: Well-developed, well-nourished female in no acute distress. Overweight. PSYCHIATRIC: Normal mood and affect. Normal behavior. Normal judgment and thought content. [place]: Alert and oriented to person, place, and time. Normal muscle tone coordination. No cranial nerve deficit noted. HENT:  Normocephalic, atraumatic, External right and  left ear normal. Oropharynx is clear and moist EYES: Conjunctivae and EOM are normal. Pupils are equal, round, and reactive to light. No scleral icterus.  NECK: Normal range of motion, supple, no masses.  Normal thyroid.  SKIN: Skin is warm and dry. No rash noted. Not diaphoretic. No erythema. No pallor. CARDIOVASCULAR: Normal heart rate noted, regular rhythm, no murmur. RESPIRATORY: Clear to auscultation bilaterally. Effort and breath sounds normal, no problems with respiration noted. BREASTS: Symmetric in size. No masses, skin changes, nipple drainage, or lymphadenopathy. ABDOMEN: Soft, normal bowel sounds, no distention noted.  No tenderness, rebound or guarding.  BLADDER: Normal PELVIC: external genitalia normal, rectovaginal septum normal.  Vagina without discharge, mild atrophy present.  Cervix normal appearing, no lesions and no motion tenderness.  Uterus mobile, nontender, normal shape and size.  Adnexae non-palpable, nontender bilaterally.  RECTAL: External Exam NormaI, No Rectal Masses and Normal Sphincter tone  MUSCULOSKELETAL: Normal range of  motion. No tenderness.  No cyanosis, clubbing, or edema.  2+ distal pulses. LYMPHATIC: No Axillary, Supraclavicular, or Inguinal Adenopathy.   Labs:  . Lab Results  Component Value Date   WBC 8.4 02/07/2016   HGB 12.9 02/07/2016   HCT 39.0 02/07/2016   MCV 91 02/07/2016   PLT 206 02/07/2016    Lab Results  Component Value Date   CREATININE 0.94 11/18/2019   BUN 13 11/18/2019   NA 139 11/18/2019   K 4.6 11/18/2019   CL 103 11/18/2019   CO2 25 11/18/2019    Lab Results  Component Value Date   ALT 13 02/07/2016   AST 20 02/07/2016   ALKPHOS 80 02/07/2016   BILITOT 0.3 02/07/2016    Lab Results  Component Value Date   CHOL 232 (H) 11/18/2019   HDL 75 11/18/2019   LDLCALC 142 (H) 11/18/2019   TRIG 88 11/18/2019   CHOLHDL 3.3 05/27/2019     Lab Results  Component Value Date   HGBA1C 6.2 (H) 11/18/2019    Lab Results  Component Value Date   TSH 1.100 05/27/2019     Assessment:   Encounter for well woman exam with routine gynecological exam  Family history of breast cancer in sister  Osteopenia of neck of femur, unspecified laterality Overweight Dyslipidemia Type 2 diabetes mellitus without complication, without long-term current use of insulin ([place])  Plan:   Pap smear up to date.  No longer requires further cervical cancer screening based on age.  Mammogram: Order placed, patient will call to schedule appointment. Stool Guaiac Testing: not indicated. Colonoscopy up to date.  Recommend repeat in 10 years.  Labs: None.  Drawn by PCP q 6 months.   Routine preventative health maintenance measures emphasized: Exercise/Diet/Weight control, Alcohol/Substance use risks and Stress Management.  Dyslipidemia and Type II DM managed by PCP.  Discussed healthy lifestyle modifications.  Osteopenia: Counseled on Calcium and Vitamin D supplementation. DEXA Scans ordered routinely by PCP. Return to Clinic -  As needed.  Has a PCP that manages her medical issues. Has no  GYN concerns and is beyond age of cervical screenings. Patient wonders if she has to see a GYN regularly. Discussed that as long as her PCP ordered her other screenings (colon, breast), she could see GYN prn.    Rubie Maid, MD  [facility]

## 2020-02-15 DIAGNOSIS — H269 Unspecified cataract: Secondary | ICD-10-CM | POA: Diagnosis not present

## 2020-02-15 DIAGNOSIS — E669 Obesity, unspecified: Secondary | ICD-10-CM | POA: Diagnosis not present

## 2020-02-15 DIAGNOSIS — Z6831 Body mass index (BMI) 31.0-31.9, adult: Secondary | ICD-10-CM | POA: Diagnosis not present

## 2020-02-15 DIAGNOSIS — R7303 Prediabetes: Secondary | ICD-10-CM | POA: Diagnosis not present

## 2020-03-16 ENCOUNTER — Telehealth: Payer: Self-pay | Admitting: Family Medicine

## 2020-03-16 NOTE — Telephone Encounter (Signed)
Under Dr Andreas Blower name- scheduled for 04/05/2020 @ 11:20 in [place]

## 2020-03-16 NOTE — Telephone Encounter (Signed)
Copied from [place] [phone]. Topic: Referral - Request for Referral >> Mar 16, 2020 10:24 AM Celene Kras wrote: Has patient seen PCP for this complaint? Yes.   *If NO, is insurance requiring patient see PCP for this issue before PCP can refer them? Referral for which specialty: Mammogram  Preferred provider/office: N/A Reason for referral: Pt called stating that she is needing to have a mammogram scheduled. Please advise.

## 2020-03-30 ENCOUNTER — Other Ambulatory Visit: Payer: Self-pay

## 2020-03-30 ENCOUNTER — Ambulatory Visit (INDEPENDENT_AMBULATORY_CARE_PROVIDER_SITE_OTHER): Payer: Medicare PPO

## 2020-03-30 DIAGNOSIS — Z23 Encounter for immunization: Secondary | ICD-10-CM

## 2020-04-05 ENCOUNTER — Ambulatory Visit: Payer: Medicare HMO

## 2020-04-20 ENCOUNTER — Other Ambulatory Visit: Payer: Self-pay

## 2020-04-20 ENCOUNTER — Encounter (INDEPENDENT_AMBULATORY_CARE_PROVIDER_SITE_OTHER): Payer: Self-pay

## 2020-04-20 ENCOUNTER — Ambulatory Visit
Admission: RE | Admit: 2020-04-20 | Discharge: 2020-04-20 | Disposition: A | Discharge status: Home / Self Care | Payer: Medicare PPO | Source: Ambulatory Visit | Attending: Obstetrics and Gynecology | Admitting: Obstetrics and Gynecology

## 2020-04-20 DIAGNOSIS — Z1231 Encounter for screening mammogram for malignant neoplasm of breast: Secondary | ICD-10-CM | POA: Diagnosis not present

## 2020-05-23 ENCOUNTER — Ambulatory Visit: Payer: Medicare PPO | Admitting: Family Medicine

## 2020-05-25 ENCOUNTER — Ambulatory Visit: Payer: Medicare PPO | Admitting: Family Medicine

## 2020-05-25 ENCOUNTER — Encounter: Payer: Self-pay | Admitting: Family Medicine

## 2020-05-25 ENCOUNTER — Other Ambulatory Visit: Payer: Self-pay

## 2020-05-25 VITALS — BP 120/76 | HR 80 | Ht 63.0 in | Wt 177.0 lb

## 2020-05-25 DIAGNOSIS — E7801 Familial hypercholesterolemia: Secondary | ICD-10-CM | POA: Diagnosis not present

## 2020-05-25 DIAGNOSIS — R7303 Prediabetes: Secondary | ICD-10-CM

## 2020-05-25 MED ORDER — SIMVASTATIN 10 MG PO TABS: 10.0000 mg | ORAL_TABLET | Freq: Every day | ORAL | 1 refills | Status: DC

## 2020-05-25 NOTE — Progress Notes (Signed)
Date:  05/25/2020   Name:  Wendy Tucker   DOB:  [birth year]   MRN:  [id number]   Chief Complaint: Hyperlipidemia, Prediabetes (diet controlled), and Allergic Rhinitis   Hyperlipidemia This is a chronic problem. The current episode started more than 1 year ago. The problem is controlled. Recent lipid tests were reviewed and are normal. She has no history of chronic renal disease, diabetes, hypothyroidism, liver disease, obesity or nephrotic syndrome. There are no known factors aggravating her hyperlipidemia. Pertinent negatives include no chest pain, focal sensory loss, focal weakness, leg pain, myalgias or shortness of breath. Current antihyperlipidemic treatment includes statins. The current treatment provides moderate improvement of lipids. There are no compliance problems.  Risk factors for coronary artery disease include dyslipidemia.  Diabetes She presents for her follow-up diabetic visit. Diabetes type: prediabetes. Her disease course has been stable. There are no hypoglycemic associated symptoms. Pertinent negatives for hypoglycemia include no dizziness, headaches or nervousness/anxiousness. There are no diabetic associated symptoms. Pertinent negatives for diabetes include no chest pain, no fatigue, no polydipsia, no polyphagia, no polyuria and no weakness. There are no hypoglycemic complications. Symptoms are stable. There are no diabetic complications. There are no known risk factors for coronary artery disease. Current diabetic treatment includes diet. She is following a generally healthy diet. Meal planning includes avoidance of concentrated sweets and carbohydrate counting. She participates in exercise intermittently. Her home blood glucose trend is fluctuating minimally.    Lab Results  Component Value Date   CREATININE 0.94 11/18/2019   BUN 13 11/18/2019   NA 139 11/18/2019   K 4.6 11/18/2019   CL 103 11/18/2019   CO2 25 11/18/2019   Lab Results  Component Value Date    CHOL 232 (H) 11/18/2019   HDL 75 11/18/2019   LDLCALC 142 (H) 11/18/2019   TRIG 88 11/18/2019   CHOLHDL 3.3 05/27/2019   Lab Results  Component Value Date   TSH 1.100 05/27/2019   Lab Results  Component Value Date   HGBA1C 6.2 (H) 11/18/2019   Lab Results  Component Value Date   WBC 8.4 02/07/2016   HGB 12.9 02/07/2016   HCT 39.0 02/07/2016   MCV 91 02/07/2016   PLT 206 02/07/2016   Lab Results  Component Value Date   ALT 13 02/07/2016   AST 20 02/07/2016   ALKPHOS 80 02/07/2016   BILITOT 0.3 02/07/2016     Review of Systems  Constitutional: Negative.  Negative for chills, fatigue, fever and unexpected weight change.  HENT: Negative for congestion, ear discharge, ear pain, rhinorrhea, sinus pressure, sneezing and sore throat.   Eyes: Negative for photophobia, pain, discharge, redness and itching.  Respiratory: Negative for cough, shortness of breath, wheezing and stridor.   Cardiovascular: Negative for chest pain.  Gastrointestinal: Negative for abdominal pain, blood in stool, constipation, diarrhea, nausea and vomiting.  Endocrine: Negative for cold intolerance, heat intolerance, polydipsia, polyphagia and polyuria.  Genitourinary: Negative for dysuria, flank pain, frequency, hematuria, menstrual problem, pelvic pain, urgency, vaginal bleeding and vaginal discharge.  Musculoskeletal: Negative for arthralgias, back pain and myalgias.  Skin: Negative for rash.  Allergic/Immunologic: Negative for environmental allergies and food allergies.  Neurological: Negative for dizziness, focal weakness, weakness, light-headedness, numbness and headaches.  Hematological: Negative for adenopathy. Does not bruise/bleed easily.  Psychiatric/Behavioral: Negative for dysphoric mood. The patient is not nervous/anxious.     Patient Active Problem List   Diagnosis Date Noted  . Special screening for malignant neoplasms, colon   .  Polyp of sigmoid colon   . Benign neoplasm of descending  colon   . Hyperlipidemia 11/21/2016  . Vitamin D deficiency 10/24/2016  . Osteopenia 10/24/2016  . Diabetes mellitus type 2, controlled, without complications ([place]) 15/11/6977  . B12 deficiency 10/24/2016  . Obesity (BMI 30.0-34.9) 10/24/2016  . FH: Alzheimer's disease 10/24/2016  . FH: gastric cancer 10/24/2016  . FH: breast cancer 10/24/2016    Allergies  Allergen Reactions  . Cephalexin   . Amoxicillin Rash    Past Surgical History:  Procedure Laterality Date  . BREAST BIOPSY Bilateral    neg- needle bx/clips  . BREAST BIOPSY Right 03/18/2019   affirm bx of calcs medial, coil clip, negative  . COLONOSCOPY WITH PROPOFOL N/A 11/28/2016   Procedure: COLONOSCOPY WITH PROPOFOL;  Surgeon: Lucilla Lame, MD;  Location: [facility];  Service: Endoscopy;  Laterality: N/A;  . ECTOPIC PREGNANCY SURGERY    . fractured leg Right 2008   has metal rods  . POLYPECTOMY  11/28/2016   Procedure: POLYPECTOMY;  Surgeon: Lucilla Lame, MD;  Location: [facility];  Service: Endoscopy;;    Social History   Tobacco Use  . Smoking status: Never Smoker  . Smokeless tobacco: Never Used  Vaping Use  . Vaping Use: Never used  Substance Use Topics  . Alcohol use: Yes    Comment: Occasionally, 2 glass wine/month  . Drug use: No     Medication list has been reviewed and updated.  Current Meds  Medication Sig  . aspirin 81 MG tablet Take 1 tablet (81 mg total) by mouth daily.  . cetirizine (ZYRTEC) 10 MG tablet Take 10 mg by mouth daily.  . cholecalciferol (VITAMIN D) 1000 units tablet Take 1,000 Units by mouth daily.  . Multiple Minerals-Vitamins (NUTRA-SUPPORT BONE) CAPS Take 1 capsule by mouth daily.  . vitamin B-12 (CYANOCOBALAMIN) 500 MCG tablet Take 1 tablet (500 mcg total) by mouth daily.    PHQ 2/9 Scores 11/18/2019 11/17/2019 05/07/2018 01/29/2017  PHQ - 2 Score 0 0 0 0  PHQ- 9 Score 0 - 0 -    GAD 7 : Generalized Anxiety Score 11/18/2019  Nervous, Anxious, on Edge 0   Control/stop worrying 0  Worry too much - different things 0  Trouble relaxing 0  Restless 0  Easily annoyed or irritable 0  Afraid - awful might happen 0  Total GAD 7 Score 0    BP Readings from Last 3 Encounters:  05/25/20 120/76  01/27/20 117/64  11/18/19 120/62    Physical Exam Vitals and nursing note reviewed.  Constitutional:      Appearance: She is well-developed.  HENT:     Head: Normocephalic.     Right Ear: Tympanic membrane, ear canal and external ear normal.     Left Ear: Tympanic membrane, ear canal and external ear normal.     Nose: Nose normal.     Mouth/Throat:     Mouth: Mucous membranes are moist.  Eyes:     General: Lids are everted, no foreign bodies appreciated. No scleral icterus.       Left eye: No foreign body or hordeolum.     Conjunctiva/sclera: Conjunctivae normal.     Right eye: Right conjunctiva is not injected.     Left eye: Left conjunctiva is not injected.     Pupils: Pupils are equal, round, and reactive to light.  Neck:     Thyroid: No thyromegaly.     Vascular: No carotid bruit or JVD.  Trachea: No tracheal deviation.  Cardiovascular:     Rate and Rhythm: Normal rate and regular rhythm.     Heart sounds: Normal heart sounds. No murmur heard.  No friction rub. No gallop.   Pulmonary:     Effort: Pulmonary effort is normal. No respiratory distress.     Breath sounds: Normal breath sounds. No wheezing, rhonchi or rales.  Abdominal:     General: Bowel sounds are normal.     Palpations: Abdomen is soft. There is no mass.     Tenderness: There is no abdominal tenderness. There is no right CVA tenderness, left CVA tenderness, guarding or rebound.  Musculoskeletal:        General: No tenderness. Normal range of motion.     Cervical back: Normal range of motion and neck supple. No rigidity or tenderness.  Lymphadenopathy:     Cervical: No cervical adenopathy.  Skin:    General: Skin is warm.     Findings: No rash.  Neurological:      Mental Status: She is alert and oriented to person, place, and time.     Cranial Nerves: No cranial nerve deficit.     Deep Tendon Reflexes: Reflexes normal.  Psychiatric:        Mood and Affect: Mood is not anxious or depressed.     Wt Readings from Last 3 Encounters:  05/25/20 177 lb (80.3 kg)  01/27/20 173 lb 4.8 oz (78.6 kg)  11/18/19 171 lb (77.6 kg)    BP 120/76   Pulse 80   Ht 5\' 3"  (1.6 m)   Wt 177 lb (80.3 kg)   LMP 02/07/1991   BMI 31.35 kg/m   Assessment and Plan: 1. Prediabetes Chronic.  Controlled.  Stable.  Will recheck A1c to note status of prediabetes which is currently being controlled with diet.  Will check renal function panel microalbuminuria for current status of GFR. - Renal Function Panel - HgB A1c - Microalbumin, urine  2. Familial hypercholesterolemia Chronic.  Controlled.  Stable.  Continue dietary approach to controlling hypercholesterolemia.  Will check LDL for current status. - Lipid Panel With LDL/HDL Ratio - simvastatin (ZOCOR) 10 MG tablet; Take 1 tablet (10 mg total) by mouth at bedtime.  Dispense: 90 tablet; Refill: 1

## 2020-05-26 LAB — LIPID PANEL WITH LDL/HDL RATIO
Cholesterol, Total: 239 mg/dL — ABNORMAL HIGH (ref 100–199)
HDL: 84 mg/dL (ref >39)
LDL Chol Calc (NIH): 141 mg/dL — ABNORMAL HIGH (ref 0–99)
LDL/HDL Ratio: 1.7 ratio (ref 0.0–3.2)
Triglycerides: 84 mg/dL (ref 0–149)
VLDL Cholesterol Cal: 14 mg/dL (ref 5–40)

## 2020-05-26 LAB — HEMOGLOBIN A1C
Est. average glucose Bld gHb Est-mCnc: 137 mg/dL
Hgb A1c MFr Bld: 6.4 % — ABNORMAL HIGH (ref 4.8–5.6)

## 2020-05-26 LAB — RENAL FUNCTION PANEL
Albumin: 4.4 g/dL (ref 3.8–4.8)
BUN/Creatinine Ratio: 13 (ref 12–28)
BUN: 13 mg/dL (ref 8–27)
CO2: 25 mmol/L (ref 20–29)
Calcium: 9.7 mg/dL (ref 8.7–10.3)
Chloride: 102 mmol/L (ref 96–106)
Creatinine, Ser: 0.99 mg/dL (ref 0.57–1.00)
GFR calc Af Amer: 68 mL/min/{1.73_m2} (ref >59)
GFR calc non Af Amer: 59 mL/min/{1.73_m2} — ABNORMAL LOW (ref >59)
Glucose: 108 mg/dL — ABNORMAL HIGH (ref 65–99)
Phosphorus: 3.8 mg/dL (ref 3.0–4.3)
Potassium: 4.6 mmol/L (ref 3.5–5.2)
Sodium: 138 mmol/L (ref 134–144)

## 2020-05-26 LAB — MICROALBUMIN, URINE: Microalbumin, Urine: 4.1 ug/mL

## 2020-05-30 ENCOUNTER — Other Ambulatory Visit: Payer: Self-pay

## 2020-05-30 DIAGNOSIS — R7303 Prediabetes: Secondary | ICD-10-CM

## 2020-05-30 MED ORDER — METFORMIN HCL 500 MG PO TABS: 500.0000 mg | ORAL_TABLET | Freq: Two times a day (BID) | ORAL | 1 refills | Status: DC

## 2020-05-30 NOTE — Progress Notes (Unsigned)
Sent in metformin and pt will stay on simvastatin

## 2020-06-09 DIAGNOSIS — H15101 Unspecified episcleritis, right eye: Secondary | ICD-10-CM | POA: Diagnosis not present

## 2020-07-03 DIAGNOSIS — H40003 Preglaucoma, unspecified, bilateral: Secondary | ICD-10-CM | POA: Diagnosis not present

## 2020-07-03 DIAGNOSIS — H2513 Age-related nuclear cataract, bilateral: Secondary | ICD-10-CM | POA: Diagnosis not present

## 2020-07-03 DIAGNOSIS — H40033 Anatomical narrow angle, bilateral: Secondary | ICD-10-CM | POA: Diagnosis not present

## 2020-09-11 ENCOUNTER — Other Ambulatory Visit: Payer: Self-pay

## 2020-09-11 ENCOUNTER — Ambulatory Visit (INDEPENDENT_AMBULATORY_CARE_PROVIDER_SITE_OTHER): Payer: Medicare HMO | Admitting: Family Medicine

## 2020-09-11 ENCOUNTER — Encounter: Payer: Self-pay | Admitting: Family Medicine

## 2020-09-11 VITALS — BP 136/70 | HR 60 | Ht 63.0 in | Wt 182.0 lb

## 2020-09-11 DIAGNOSIS — E7801 Familial hypercholesterolemia: Secondary | ICD-10-CM

## 2020-09-11 DIAGNOSIS — R7303 Prediabetes: Secondary | ICD-10-CM | POA: Diagnosis not present

## 2020-09-11 MED ORDER — SIMVASTATIN 10 MG PO TABS: 10.0000 mg | ORAL_TABLET | Freq: Every day | ORAL | 1 refills | Status: DC

## 2020-09-11 MED ORDER — METFORMIN HCL 500 MG PO TABS: 500.0000 mg | ORAL_TABLET | Freq: Two times a day (BID) | ORAL | 1 refills | Status: DC

## 2020-09-11 NOTE — Patient Instructions (Signed)

## 2020-09-11 NOTE — Progress Notes (Signed)
Date:  09/11/2020   Name:  Wendy Tucker   DOB:  May 09, 1951   MRN:  [id number]   Chief Complaint: Prediabetes  Diabetes She presents for her follow-up diabetic visit. She has type 2 diabetes mellitus. Her disease course has been improving. There are no hypoglycemic associated symptoms. Pertinent negatives for hypoglycemia include no dizziness, headaches or nervousness/anxiousness. There are no diabetic associated symptoms. Pertinent negatives for diabetes include no blurred vision, no chest pain, no fatigue, no foot paresthesias, no foot ulcerations, no polydipsia, no polyphagia, no polyuria, no visual change, no weakness and no weight loss. There are no hypoglycemic complications. Symptoms are stable. There are no diabetic complications. Current diabetic treatment includes oral agent (monotherapy) (metformen out for a month). Her weight is increasing steadily. She is following a generally healthy diet. Meal planning includes avoidance of concentrated sweets and carbohydrate counting. Her breakfast blood glucose is taken between 8-9 am. An ACE inhibitor/angiotensin II receptor blocker is not being taken. Eye exam is not current.    Lab Results  Component Value Date   CREATININE 0.99 05/25/2020   BUN 13 05/25/2020   NA 138 05/25/2020   K 4.6 05/25/2020   CL 102 05/25/2020   CO2 25 05/25/2020   Lab Results  Component Value Date   CHOL 239 (H) 05/25/2020   HDL 84 05/25/2020   LDLCALC 141 (H) 05/25/2020   TRIG 84 05/25/2020   CHOLHDL 3.3 05/27/2019   Lab Results  Component Value Date   TSH 1.100 05/27/2019   Lab Results  Component Value Date   HGBA1C 6.4 (H) 05/25/2020   Lab Results  Component Value Date   WBC 8.4 02/07/2016   HGB 12.9 02/07/2016   HCT 39.0 02/07/2016   MCV 91 02/07/2016   PLT 206 02/07/2016   Lab Results  Component Value Date   ALT 13 02/07/2016   AST 20 02/07/2016   ALKPHOS 80 02/07/2016   BILITOT 0.3 02/07/2016     Review of Systems   Constitutional: Negative for chills, fatigue, fever and weight loss.  HENT: Negative for drooling, ear discharge, ear pain and sore throat.   Eyes: Negative for blurred vision.  Respiratory: Negative for cough, shortness of breath and wheezing.   Cardiovascular: Negative for chest pain, palpitations and leg swelling.  Gastrointestinal: Negative for abdominal pain, blood in stool, constipation, diarrhea and nausea.  Endocrine: Negative for polydipsia, polyphagia and polyuria.  Genitourinary: Negative for dysuria, frequency, hematuria and urgency.  Musculoskeletal: Negative for back pain, myalgias and neck pain.  Skin: Negative for rash.  Allergic/Immunologic: Negative for environmental allergies.  Neurological: Negative for dizziness, weakness and headaches.  Hematological: Does not bruise/bleed easily.  Psychiatric/Behavioral: Negative for suicidal ideas. The patient is not nervous/anxious.     Patient Active Problem List   Diagnosis Date Noted  . Special screening for malignant neoplasms, colon   . Polyp of sigmoid colon   . Benign neoplasm of descending colon   . Hyperlipidemia 11/21/2016  . Vitamin D deficiency 10/24/2016  . Osteopenia 10/24/2016  . Diabetes mellitus type 2, controlled, without complications ([place]) 81/07/7508  . B12 deficiency 10/24/2016  . Obesity (BMI 30.0-34.9) 10/24/2016  . FH: Alzheimer's disease 10/24/2016  . FH: gastric cancer 10/24/2016  . FH: breast cancer 10/24/2016    Allergies  Allergen Reactions  . Cephalexin   . Amoxicillin Rash    Past Surgical History:  Procedure Laterality Date  . BREAST BIOPSY Bilateral    neg- needle bx/clips  .  BREAST BIOPSY Right 03/18/2019   affirm bx of calcs medial, coil clip, negative  . COLONOSCOPY WITH PROPOFOL N/A 11/28/2016   Procedure: COLONOSCOPY WITH PROPOFOL;  Surgeon: Lucilla Lame, MD;  Location: [facility];  Service: Endoscopy;  Laterality: N/A;  . ECTOPIC PREGNANCY SURGERY    . fractured  leg Right 2008   has metal rods  . POLYPECTOMY  11/28/2016   Procedure: POLYPECTOMY;  Surgeon: Lucilla Lame, MD;  Location: [place] [facility];  Service: Endoscopy;;    Social History   Tobacco Use  . Smoking status: Never Smoker  . Smokeless tobacco: Never Used  Vaping Use  . Vaping Use: Never used  Substance Use Topics  . Alcohol use: Yes    Comment: Occasionally, 2 glass wine/month  . Drug use: No     Medication list has been reviewed and updated.  Current Meds  Medication Sig  . aspirin 81 MG tablet Take 1 tablet (81 mg total) by mouth daily.  . cetirizine (ZYRTEC) 10 MG tablet Take 10 mg by mouth daily.  . cholecalciferol (VITAMIN D) 1000 units tablet Take 1,000 Units by mouth daily.  . Multiple Minerals-Vitamins (NUTRA-SUPPORT BONE) CAPS Take 1 capsule by mouth daily.  . simvastatin (ZOCOR) 10 MG tablet Take 1 tablet (10 mg total) by mouth at bedtime.  . vitamin B-12 (CYANOCOBALAMIN) 500 MCG tablet Take 1 tablet (500 mcg total) by mouth daily.    PHQ 2/9 Scores 09/11/2020 11/18/2019 11/17/2019 05/07/2018  PHQ - 2 Score 0 0 0 0  PHQ- 9 Score 0 0 - 0    GAD 7 : Generalized Anxiety Score 09/11/2020 11/18/2019  Nervous, Anxious, on Edge 0 0  Control/stop worrying 0 0  Worry too much - different things 0 0  Trouble relaxing 0 0  Restless 0 0  Easily annoyed or irritable 0 0  Afraid - awful might happen 0 0  Total GAD 7 Score 0 0    BP Readings from Last 3 Encounters:  09/11/20 136/70  05/25/20 120/76  01/27/20 117/64    Physical Exam Vitals and nursing note reviewed.  Constitutional:      Appearance: She is well-developed and well-nourished.  HENT:     Head: Normocephalic.     Right Ear: Tympanic membrane, ear canal and external ear normal.     Left Ear: Tympanic membrane, ear canal and external ear normal.     Nose: Nose normal.     Mouth/Throat:     Mouth: Oropharynx is clear and moist. Mucous membranes are moist.  Eyes:     General: Lids are everted, no  foreign bodies appreciated. No scleral icterus.       Left eye: No foreign body or hordeolum.     Extraocular Movements: Extraocular movements intact and EOM normal.     Conjunctiva/sclera: Conjunctivae normal.     Right eye: Right conjunctiva is not injected.     Left eye: Left conjunctiva is not injected.     Pupils: Pupils are equal, round, and reactive to light.  Neck:     Thyroid: No thyromegaly.     Vascular: No JVD.     Trachea: No tracheal deviation.  Cardiovascular:     Rate and Rhythm: Normal rate and regular rhythm.     Pulses: Intact distal pulses.          Dorsalis pedis pulses are 2+ on the right side and 2+ on the left side.       Posterior tibial pulses are  2+ on the right side and 2+ on the left side.     Heart sounds: Normal heart sounds. No murmur heard. No friction rub. No gallop.   Pulmonary:     Effort: Pulmonary effort is normal. No respiratory distress.     Breath sounds: Normal breath sounds. No wheezing, rhonchi or rales.  Abdominal:     General: Bowel sounds are normal. There is no distension.     Palpations: Abdomen is soft. There is no hepatosplenomegaly or mass.     Tenderness: There is no abdominal tenderness. There is no guarding or rebound.  Musculoskeletal:        General: No tenderness or edema. Normal range of motion.     Cervical back: Normal range of motion and neck supple.     Right foot: Normal range of motion.     Left foot: Normal range of motion.  Feet:     Right foot:     Protective Sensation: 10 sites tested. 10 sites sensed.     Skin integrity: Skin integrity normal. No ulcer, blister, skin breakdown, callus or dry skin.     Left foot:     Protective Sensation: 10 sites tested. 10 sites sensed.     Skin integrity: Skin integrity normal. No ulcer, blister or skin breakdown.  Lymphadenopathy:     Cervical: No cervical adenopathy.  Skin:    General: Skin is warm.     Capillary Refill: Capillary refill takes less than 2 seconds.      Coloration: Skin is not pale.     Findings: No rash.  Neurological:     Mental Status: She is alert and oriented to person, place, and time.     Cranial Nerves: No cranial nerve deficit.     Deep Tendon Reflexes: Strength normal. Reflexes normal.  Psychiatric:        Mood and Affect: Mood and affect normal. Mood is not anxious or depressed.     Wt Readings from Last 3 Encounters:  09/11/20 182 lb (82.6 kg)  05/25/20 177 lb (80.3 kg)  01/27/20 173 lb 4.8 oz (78.6 kg)    BP 136/70   Pulse 60   Ht 5\' 3"  (1.6 m)   Wt 182 lb (82.6 kg)   LMP 02/07/1991   BMI 32.24 kg/m   Assessment and Plan: 1. Prediabetes  Chronic.  Controlled.  Stable.  Patient has been out of her Metformin and will going to refill it however we will check an A1c is going to be interesting to see where it is off medication and just diet controlled.  We will also check a renal function panel to look at GFR and electrolytes.  She has been given a low glycemic sheet disorder have better selection of low sugar-containing foods for her diet. - HgB A1c - Renal Function Panel  2. Familial hypercholesterolemia Chronic.  Controlled.  Stable.  We will obtain a lipid panel in the meantime we will continue simvastatin 10 mg once a day. - Lipid Panel With LDL/HDL Ratio

## 2020-09-12 ENCOUNTER — Ambulatory Visit: Payer: Medicare PPO | Admitting: Family Medicine

## 2020-09-12 LAB — LIPID PANEL WITH LDL/HDL RATIO
Cholesterol, Total: 205 mg/dL — ABNORMAL HIGH (ref 100–199)
HDL: 76 mg/dL (ref >39)
LDL Chol Calc (NIH): 113 mg/dL — ABNORMAL HIGH (ref 0–99)
LDL/HDL Ratio: 1.5 ratio (ref 0.0–3.2)
Triglycerides: 92 mg/dL (ref 0–149)
VLDL Cholesterol Cal: 16 mg/dL (ref 5–40)

## 2020-09-12 LAB — RENAL FUNCTION PANEL
Albumin: 4.5 g/dL (ref 3.8–4.8)
BUN/Creatinine Ratio: 18 (ref 12–28)
BUN: 15 mg/dL (ref 8–27)
CO2: 25 mmol/L (ref 20–29)
Calcium: 9.8 mg/dL (ref 8.7–10.3)
Chloride: 104 mmol/L (ref 96–106)
Creatinine, Ser: 0.84 mg/dL (ref 0.57–1.00)
Glucose: 89 mg/dL (ref 65–99)
Phosphorus: 4 mg/dL (ref 3.0–4.3)
Potassium: 4.3 mmol/L (ref 3.5–5.2)
Sodium: 140 mmol/L (ref 134–144)
eGFR: 75 mL/min/{1.73_m2} (ref >59)

## 2020-09-12 LAB — HEMOGLOBIN A1C
Est. average glucose Bld gHb Est-mCnc: 134 mg/dL
Hgb A1c MFr Bld: 6.3 % — ABNORMAL HIGH (ref 4.8–5.6)

## 2020-09-15 ENCOUNTER — Telehealth: Payer: Self-pay

## 2020-09-15 NOTE — Telephone Encounter (Signed)
Called pt told her to take the meter to the pharmacy that she got it from. Told pt if the pharmacy was not able to help her to stop by the office and I will help her. Pt verbalized understanding.  KP

## 2020-09-15 NOTE — Telephone Encounter (Unsigned)
Copied from [place] [id number]. Topic: General - Inquiry >> Sep 15, 2020  9:03 AM Greggory Keen D wrote: Reason for CRM: Pt called asking if Dr Ronnald Ramp nurse could give her a call.  Pt did not disclose what the call was about.   [phone]

## 2020-11-14 ENCOUNTER — Other Ambulatory Visit: Payer: Self-pay | Admitting: Family Medicine

## 2020-11-14 DIAGNOSIS — R7303 Prediabetes: Secondary | ICD-10-CM

## 2020-11-14 DIAGNOSIS — E7801 Familial hypercholesterolemia: Secondary | ICD-10-CM

## 2020-11-14 MED ORDER — METFORMIN HCL 500 MG PO TABS: 500.0000 mg | ORAL_TABLET | Freq: Two times a day (BID) | ORAL | 1 refills | Status: DC

## 2020-11-14 MED ORDER — SIMVASTATIN 10 MG PO TABS: 10.0000 mg | ORAL_TABLET | Freq: Every day | ORAL | 1 refills | Status: DC

## 2020-11-14 NOTE — Telephone Encounter (Signed)
Medication Refill - Medication: simvastatin (ZOCOR) 10 MG tablet , metFORMIN (GLUCOPHAGE) 500 MG tablet     Has the patient contacted their pharmacy? Yes.    (Agent: If yes, when and what did the pharmacy advise?) Contact PCP office and patient stated that PCP advised her to call the practice  Preferred Pharmacy (with phone number or street name):  [id number] [place] [address] AT [address] Phone:  [phone]  Fax:  [phone]         Agent: Please be advised that RX refills may take up to 3 business days. We ask that you follow-up with your pharmacy.

## 2020-11-15 MED ORDER — SIMVASTATIN 10 MG PO TABS: 10.0000 mg | ORAL_TABLET | Freq: Every day | ORAL | 1 refills | Status: DC

## 2020-11-15 NOTE — Telephone Encounter (Signed)
Pt called in for assistance. Pt says that she received a call from her pharmacy stating that they never received Rx for  simvastatin (ZOCOR) 10 MG tablet 90 tablet 1 11/14/2020    Pt would like to know if Rx could be resent?   Please assist.

## 2020-11-15 NOTE — Addendum Note (Signed)
Addended by: Addison Naegeli on: 11/15/2020 02:20 PM   Modules accepted: Orders

## 2020-11-15 NOTE — Telephone Encounter (Signed)
Requested Prescriptions  Pending Prescriptions Disp Refills  . simvastatin (ZOCOR) 10 MG tablet 90 tablet 1    Sig: Take 1 tablet (10 mg total) by mouth at bedtime.     Cardiovascular:  Antilipid - Statins Failed - 11/15/2020  2:20 PM      Failed - Total Cholesterol in normal range and within 360 days    Cholesterol, Total  Date Value Ref Range Status  09/11/2020 205 (H) 100 - 199 mg/dL Final         Failed - LDL in normal range and within 360 days    LDL Chol Calc (NIH)  Date Value Ref Range Status  09/11/2020 113 (H) 0 - 99 mg/dL Final         Passed - HDL in normal range and within 360 days    HDL  Date Value Ref Range Status  09/11/2020 76 >39 mg/dL Final         Passed - Triglycerides in normal range and within 360 days    Triglycerides  Date Value Ref Range Status  09/11/2020 92 0 - 149 mg/dL Final         Passed - Patient is not pregnant      Passed - Valid encounter within last 12 months    Recent Outpatient Visits          2 months ago Prediabetes   [facility] Juline Patch, [facility]   5 months ago Prediabetes   [facility] Juline Patch, MD   12 months ago Prediabetes   [facility] Juline Patch, MD   1 year ago Familial hypercholesterolemia   [facility] Juline Patch, MD   2 years ago Controlled type 2 diabetes mellitus without complication, without long-term current use of insulin ([place])   [facility] Juline Patch, MD             Signed Prescriptions Disp Refills   simvastatin (ZOCOR) 10 MG tablet 90 tablet 1    Sig: Take 1 tablet (10 mg total) by mouth at bedtime.     Cardiovascular:  Antilipid - Statins Failed - 11/14/2020  2:05 PM      Failed - Total Cholesterol in normal range and within 360 days    Cholesterol, Total  Date Value Ref Range Status  09/11/2020 205 (H) 100 - 199 mg/dL Final         Failed - LDL in normal range and within 360 days    LDL Chol Calc (NIH)  Date Value Ref  Range Status  09/11/2020 113 (H) 0 - 99 mg/dL Final         Passed - HDL in normal range and within 360 days    HDL  Date Value Ref Range Status  09/11/2020 76 >39 mg/dL Final         Passed - Triglycerides in normal range and within 360 days    Triglycerides  Date Value Ref Range Status  09/11/2020 92 0 - 149 mg/dL Final         Passed - Patient is not pregnant      Passed - Valid encounter within last 12 months    Recent Outpatient Visits          2 months ago Prediabetes   [facility] Juline Patch, MD   5 months ago Prediabetes   [facility] Juline Patch, MD   12 months ago Prediabetes  [facility] Juline Patch, MD   1 year ago Familial hypercholesterolemia   [facility] Juline Patch, MD   2 years ago Controlled type 2 diabetes mellitus without complication, without long-term current use of insulin ([place])   [facility] Juline Patch, MD              metFORMIN (GLUCOPHAGE) 500 MG tablet 60 tablet 1    Sig: Take 1 tablet (500 mg total) by mouth 2 (two) times daily with a meal.     Endocrinology:  Diabetes - Biguanides Passed - 11/14/2020  2:05 PM      Passed - Cr in normal range and within 360 days    Creatinine, Ser  Date Value Ref Range Status  09/11/2020 0.84 0.57 - 1.00 mg/dL Final         Passed - HBA1C is between 0 and 7.9 and within 180 days    Hemoglobin A1C  Date Value Ref Range Status  06/20/2019 6.0  Final   Hgb A1c MFr Bld  Date Value Ref Range Status  09/11/2020 6.3 (H) 4.8 - 5.6 % Final    Comment:             Prediabetes: 5.7 - 6.4          Diabetes: >6.4          Glycemic control for adults with diabetes: <7.0          Passed - AA eGFR in normal range and within 360 days    GFR calc Af Amer  Date Value Ref Range Status  05/25/2020 68 >59 mL/min/1.73 Final    Comment:    **In accordance with recommendations from the NKF-ASN Task force,**   [facility] is in the process of  updating its eGFR calculation to the   2021 CKD-EPI creatinine equation that estimates kidney function   without a race variable.    GFR calc non Af Amer  Date Value Ref Range Status  05/25/2020 59 (L) >59 mL/min/1.73 Final   eGFR  Date Value Ref Range Status  09/11/2020 75 >59 mL/min/1.73 Final         Passed - Valid encounter within last 6 months    Recent Outpatient Visits          2 months ago Prediabetes   [facility] Juline Patch, MD   5 months ago Prediabetes   [facility] Juline Patch, MD   12 months ago Prediabetes   [facility] Juline Patch, MD   1 year ago Familial hypercholesterolemia   [facility] Juline Patch, MD   2 years ago Controlled type 2 diabetes mellitus without complication, without long-term current use of insulin [facility])   [facility] Juline Patch, MD

## 2020-11-20 ENCOUNTER — Ambulatory Visit: Payer: Medicare PPO

## 2020-11-22 ENCOUNTER — Ambulatory Visit (INDEPENDENT_AMBULATORY_CARE_PROVIDER_SITE_OTHER): Payer: Medicare HMO

## 2020-11-22 ENCOUNTER — Other Ambulatory Visit: Payer: Self-pay

## 2020-11-22 VITALS — BP 112/72 | HR 63 | Temp 97.9°F | Resp 16 | Ht 63.0 in | Wt 176.0 lb

## 2020-11-22 DIAGNOSIS — Z780 Asymptomatic menopausal state: Secondary | ICD-10-CM | POA: Diagnosis not present

## 2020-11-22 DIAGNOSIS — Z0000 Encounter for general adult medical examination without abnormal findings: Secondary | ICD-10-CM

## 2020-11-22 NOTE — Patient Instructions (Signed)
Wendy Tucker , Thank you for taking time to come for your Medicare Wellness Visit. I appreciate your ongoing commitment to your health goals. Please review the following plan we discussed and let me know if I can assist you in the future.   Screening recommendations/referrals: Colonoscopy: done 11/28/16. Repeat in 2023 Mammogram: done 04/20/20 Bone Density: done 05/01/16. Please call [phone] to schedule your mammogram and bone density screening.  Recommended yearly ophthalmology/optometry visit for glaucoma screening and checkup Recommended yearly dental visit for hygiene and checkup  Vaccinations: Influenza vaccine: done 03/30/20 Pneumococcal vaccine: done 05/27/19 Tdap vaccine: done 10/24/16 Shingles vaccine: done 09/12/20; due for second dose   Covid-19:done 07/11/19, 08/08/19, 05/04/20 & 10/11/20  Advanced directives: Please bring a copy of your health care power of attorney and living will to the office at your convenience.  Conditions/risks identified: Keep up the great work!  Next appointment: Follow up in one year for your annual wellness visit    Preventive Care 65 Years and Older, Female Preventive care refers to lifestyle choices and visits with your health care provider that can promote health and wellness. What does preventive care include?  A yearly physical exam. This is also called an annual well check.  Dental exams once or twice a year.  Routine eye exams. Ask your health care provider how often you should have your eyes checked.  Personal lifestyle choices, including:  Daily care of your teeth and gums.  Regular physical activity.  Eating a healthy diet.  Avoiding tobacco and drug use.  Limiting alcohol use.  Practicing safe sex.  Taking low-dose aspirin every day.  Taking vitamin and mineral supplements as recommended by your health care provider. What happens during an annual well check? The services and screenings done by your health care provider  during your annual well check will depend on your age, overall health, lifestyle risk factors, and family history of disease. Counseling  Your health care provider may ask you questions about your:  Alcohol use.  Tobacco use.  Drug use.  Emotional well-being.  Home and relationship well-being.  Sexual activity.  Eating habits.  History of falls.  Memory and ability to understand (cognition).  Work and work Statistician.  Reproductive health. Screening  You may have the following tests or measurements:  Height, weight, and BMI.  Blood pressure.  Lipid and cholesterol levels. These may be checked every 5 years, or more frequently if you are over 42 years old.  Skin check.  Lung cancer screening. You may have this screening every year starting at age 78 if you have a 30-pack-year history of smoking and currently smoke or have quit within the past 15 years.  Fecal occult blood test (FOBT) of the stool. You may have this test every year starting at age 61.  Flexible sigmoidoscopy or colonoscopy. You may have a sigmoidoscopy every 5 years or a colonoscopy every 10 years starting at age 68.  Hepatitis C blood test.  Hepatitis B blood test.  Sexually transmitted disease (STD) testing.  Diabetes screening. This is done by checking your blood sugar (glucose) after you have not eaten for a while (fasting). You may have this done every 1-3 years.  Bone density scan. This is done to screen for osteoporosis. You may have this done starting at age 47.  Mammogram. This may be done every 1-2 years. Talk to your health care provider about how often you should have regular mammograms. Talk with your health care provider about your test results,  treatment options, and if necessary, the need for more tests. Vaccines  Your health care provider may recommend certain vaccines, such as:  Influenza vaccine. This is recommended every year.  Tetanus, diphtheria, and acellular pertussis  (Tdap, Td) vaccine. You may need a Td booster every 10 years.  Zoster vaccine. You may need this after age 18.  Pneumococcal 13-valent conjugate (PCV13) vaccine. One dose is recommended after age 15.  Pneumococcal polysaccharide (PPSV23) vaccine. One dose is recommended after age 58. Talk to your health care provider about which screenings and vaccines you need and how often you need them. This information is not intended to replace advice given to you by your health care provider. Make sure you discuss any questions you have with your health care provider. Document Released: 07/21/2015 Document Revised: 03/13/2016 Document Reviewed: 04/25/2015 Elsevier Interactive Patient Education  2017 [place] Prevention in the Home Falls can cause injuries. They can happen to people of all ages. There are many things you can do to make your home safe and to help prevent falls. What can I do on the outside of my home?  Regularly fix the edges of walkways and driveways and fix any cracks.  Remove anything that might make you trip as you walk through a door, such as a raised step or threshold.  Trim any bushes or trees on the path to your home.  Use bright outdoor lighting.  Clear any walking paths of anything that might make someone trip, such as rocks or tools.  Regularly check to see if handrails are loose or broken. Make sure that both sides of any steps have handrails.  Any raised decks and porches should have guardrails on the edges.  Have any leaves, snow, or ice cleared regularly.  Use sand or salt on walking paths during winter.  Clean up any spills in your garage right away. This includes oil or grease spills. What can I do in the bathroom?  Use night lights.  Install grab bars by the toilet and in the tub and shower. Do not use towel bars as grab bars.  Use non-skid mats or decals in the tub or shower.  If you need to sit down in the shower, use a plastic, non-slip  stool.  Keep the floor dry. Clean up any water that spills on the floor as soon as it happens.  Remove soap buildup in the tub or shower regularly.  Attach bath mats securely with double-sided non-slip rug tape.  Do not have throw rugs and other things on the floor that can make you trip. What can I do in the bedroom?  Use night lights.  Make sure that you have a light by your bed that is easy to reach.  Do not use any sheets or blankets that are too big for your bed. They should not hang down onto the floor.  Have a firm chair that has side arms. You can use this for support while you get dressed.  Do not have throw rugs and other things on the floor that can make you trip. What can I do in the kitchen?  Clean up any spills right away.  Avoid walking on wet floors.  Keep items that you use a lot in easy-to-reach places.  If you need to reach something above you, use a strong step stool that has a grab bar.  Keep electrical cords out of the way.  Do not use floor polish or wax that makes floors  slippery. If you must use wax, use non-skid floor wax.  Do not have throw rugs and other things on the floor that can make you trip. What can I do with my stairs?  Do not leave any items on the stairs.  Make sure that there are handrails on both sides of the stairs and use them. Fix handrails that are broken or loose. Make sure that handrails are as long as the stairways.  Check any carpeting to make sure that it is firmly attached to the stairs. Fix any carpet that is loose or worn.  Avoid having throw rugs at the top or bottom of the stairs. If you do have throw rugs, attach them to the floor with carpet tape.  Make sure that you have a light switch at the top of the stairs and the bottom of the stairs. If you do not have them, ask someone to add them for you. What else can I do to help prevent falls?  Wear shoes that:  Do not have high heels.  Have rubber bottoms.  Are  comfortable and fit you well.  Are closed at the toe. Do not wear sandals.  If you use a stepladder:  Make sure that it is fully opened. Do not climb a closed stepladder.  Make sure that both sides of the stepladder are locked into place.  Ask someone to hold it for you, if possible.  Clearly mark and make sure that you can see:  Any grab bars or handrails.  First and last steps.  Where the edge of each step is.  Use tools that help you move around (mobility aids) if they are needed. These include:  Canes.  Walkers.  Scooters.  Crutches.  Turn on the lights when you go into a dark area. Replace any light bulbs as soon as they burn out.  Set up your furniture so you have a clear path. Avoid moving your furniture around.  If any of your floors are uneven, fix them.  If there are any pets around you, be aware of where they are.  Review your medicines with your doctor. Some medicines can make you feel dizzy. This can increase your chance of falling. Ask your doctor what other things that you can do to help prevent falls. This information is not intended to replace advice given to you by your health care provider. Make sure you discuss any questions you have with your health care provider. Document Released: 04/20/2009 Document Revised: 11/30/2015 Document Reviewed: 07/29/2014 Elsevier Interactive Patient Education  2017 [employer].

## 2020-11-22 NOTE — Progress Notes (Signed)
Subjective:   Wendy Tucker is a 70 y.o. female who presents for Medicare Annual (Subsequent) preventive examination.  Review of Systems     Cardiac Risk Factors include: advanced age (>70men, >67 women);diabetes mellitus;dyslipidemia     Objective:    Today's Vitals   11/22/20 1420  BP: 112/72  Pulse: 63  Resp: 16  Temp: 97.9 F (36.6 C)  TempSrc: Oral  SpO2: 97%  Weight: 176 lb (79.8 kg)  Height: 5\' 3"  (1.6 m)   Body mass index is 31.18 kg/m.  Advanced Directives 11/22/2020 11/17/2019 01/29/2017 01/02/2017 11/28/2016 10/24/2016  Does Patient Have a Medical Advance Directive? Yes Yes Yes Yes Yes Yes  Type of Paramedic of [place];Living will [place];Living will - - Healthcare Power of [place]  Does patient want to make changes to medical advance directive? - - - - - No - Patient declined  Copy of [place] in Chart? No - copy requested No - copy requested - - Yes -    Current Medications (verified) Outpatient Encounter Medications as of 11/22/2020  Medication Sig  . ACCU-CHEK GUIDE test strip   . Accu-Chek Softclix Lancets lancets   . aspirin 81 MG tablet Take 1 tablet (81 mg total) by mouth daily.  . cetirizine (ZYRTEC) 10 MG tablet Take 10 mg by mouth daily.  . cholecalciferol (VITAMIN D) 1000 units tablet Take 1,000 Units by mouth daily.  . metFORMIN (GLUCOPHAGE) 500 MG tablet Take 1 tablet (500 mg total) by mouth 2 (two) times daily with a meal.  . Multiple Minerals-Vitamins (NUTRA-SUPPORT BONE) CAPS Take 1 capsule by mouth daily.  . simvastatin (ZOCOR) 10 MG tablet Take 1 tablet (10 mg total) by mouth at bedtime.  . vitamin B-12 (CYANOCOBALAMIN) 500 MCG tablet Take 1 tablet (500 mcg total) by mouth daily.   No facility-administered encounter medications on file as of 11/22/2020.    Allergies (verified) Cephalexin and Amoxicillin   History: Past Medical History:   Diagnosis Date  . Eye infection, right   . Vertigo    x1, over 20 yrs ago   Past Surgical History:  Procedure Laterality Date  . BREAST BIOPSY Bilateral    neg- needle bx/clips  . BREAST BIOPSY Right 03/18/2019   affirm bx of calcs medial, coil clip, negative  . COLONOSCOPY WITH PROPOFOL N/A 11/28/2016   Procedure: COLONOSCOPY WITH PROPOFOL;  Surgeon: Lucilla Lame, MD;  Location: [place];  Service: Endoscopy;  Laterality: N/A;  . ECTOPIC PREGNANCY SURGERY    . fractured leg Right 2008   has metal rods  . POLYPECTOMY  11/28/2016   Procedure: POLYPECTOMY;  Surgeon: Lucilla Lame, MD;  Location: [place];  Service: Endoscopy;;   Family History  Problem Relation Age of Onset  . Hypertension Mother   . Stomach cancer Father   . Breast cancer Sister 38  . Bone cancer Sister    Social History   Socioeconomic History  . Marital status: Single    Spouse name: Not on file  . Number of children: 0  . Years of education: Not on file  . Highest education level: Not on file  Occupational History  . Not on file  Tobacco Use  . Smoking status: Never Smoker  . Smokeless tobacco: Never Used  Vaping Use  . Vaping Use: Never used  Substance and Sexual Activity  . Alcohol use: Yes    Comment: Occasionally, 2 glass wine/month  .  Drug use: No  . Sexual activity: Yes    Birth control/protection: Post-menopausal  Other Topics Concern  . Not on file  Social History Narrative   Pt lives alone   Social Determinants of Health   Financial Resource Strain: Low Risk   . Difficulty of Paying Living Expenses: Not hard at all  Food Insecurity: No Food Insecurity  . Worried About Charity fundraiser in the Last Year: Never true  . Ran Out of Food in the Last Year: Never true  Transportation Needs: No Transportation Needs  . Lack of Transportation (Medical): No  . Lack of Transportation (Non-Medical): No  Physical Activity: Sufficiently Active  . Days of Exercise per  Week: 5 days  . Minutes of Exercise per Session: 30 min  Stress: No Stress Concern Present  . Feeling of Stress : Not at all  Social Connections: Moderately Isolated  . Frequency of Communication with Friends and Family: More than three times a week  . Frequency of Social Gatherings with Friends and Family: More than three times a week  . Attends Religious Services: More than 4 times per year  . Active Member of Clubs or Organizations: No  . Attends Archivist Meetings: Never  . Marital Status: Divorced    Tobacco Counseling Counseling given: Not Answered   Clinical Intake:  Pre-visit preparation completed: Yes  Pain : No/denies pain     BMI - recorded: 31.18 Nutritional Status: BMI > 30  Obese Nutritional Risks: None Diabetes: Yes CBG done?: No Did pt. bring in CBG monitor from home?: No  How often do you need to have someone help you when you read instructions, pamphlets, or other written materials from your doctor or pharmacy?: 1 - Never Nutrition Risk Assessment:  Has the patient had any N/V/D within the last 2 months?  No  Does the patient have any non-healing wounds?  No  Has the patient had any unintentional weight loss or weight gain?  No   Diabetes:  Is the patient diabetic?  Yes  If diabetic, was a CBG obtained today?  No  Did the patient bring in their glucometer from home?  No  How often do you monitor your CBG's? daily.   Financial Strains and Diabetes Management:  Are you having any financial strains with the device, your supplies or your medication? No .  Does the patient want to be seen by Chronic Care Management for management of their diabetes?  No  Would the patient like to be referred to a Nutritionist or for Diabetic Management?  No   Diabetic Exams:  Diabetic Eye Exam: Completed 01/19/19. Overdue for diabetic eye exam. Pt has been advised about the importance in completing this exam. Pt states she has an upcoming appt.   Diabetic  Foot Exam: Completed 09/11/20.   Interpreter Needed?: No  Information entered by :: Clemetine Marker LPN   Activities of Daily Living In your present state of health, do you have any difficulty performing the following activities: 11/22/2020  Hearing? N  Comment declines hearing aids  Vision? N  Difficulty concentrating or making decisions? N  Walking or climbing stairs? N  Dressing or bathing? N  Doing errands, shopping? N  Preparing Food and eating ? N  Using the Toilet? N  In the past six months, have you accidently leaked urine? N  Do you have problems with loss of bowel control? N  Managing your Medications? N  Managing your Finances? N  Housekeeping or  managing your Housekeeping? N  Some recent data might be hidden    Patient Care Team: Juline Patch, MD as PCP - General (Family Medicine) Rubie Maid, MD as Referring Physician (Obstetrics and Gynecology)  Indicate any recent Medical Services you may have received from other than [facility] providers in the past year (date may be approximate).     Assessment:   This is a routine wellness examination for [place].  Hearing/Vision screen  Hearing Screening   125Hz  250Hz  500Hz  1000Hz  2000Hz  3000Hz  4000Hz  6000Hz  8000Hz   Right ear:           Left ear:           Comments: Pt denies hearing difficulty  Vision Screening Comments: Annual vision screenings done [facility] Dr[facility] Wallace [facility]  Dietary issues and exercise activities discussed: Current Exercise Habits: Home exercise routine, Type of exercise: Other - see comments (exercise bike, yard work), Time (Minutes): 30, Frequency (Times/Week): 5, Weekly Exercise (Minutes/Week): 150, Intensity: Moderate, Exercise limited by: None identified  Goals Addressed            This Visit's Progress   . Patient Stated       Patient states she would like to maintain health and current A1c of less than 6.5%      Depression Screen PHQ 2/9 Scores 11/22/2020 09/11/2020 11/18/2019  11/17/2019 05/07/2018 01/29/2017 01/02/2017  PHQ - 2 Score 0 0 0 0 0 0 0  PHQ- 9 Score - 0 0 - 0 - -    Fall Risk Fall Risk  11/22/2020 09/11/2020 11/18/2019 11/17/2019 05/07/2018  Falls in the past year? 0 0 0 0 No  Number falls in past yr: 0 - - 0 -  Injury with Fall? 0 - - 0 -  Risk for fall due to : No Fall Risks - - No Fall Risks -  Follow up Falls prevention discussed Falls evaluation completed Falls evaluation completed Falls prevention discussed -    FALL RISK PREVENTION PERTAINING TO THE HOME:  Any stairs in or around the home? Yes  If so, are there any without handrails? No  Home free of loose throw rugs in walkways, pet beds, electrical cords, etc? Yes  Adequate lighting in your home to reduce risk of falls? Yes   ASSISTIVE DEVICES UTILIZED TO PREVENT FALLS:  Life alert? No  Use of a cane, walker or w/c? No  Grab bars in the bathroom? Yes  Shower chair or bench in shower? Yes  Elevated toilet seat or a handicapped toilet? Yes   TIMED UP AND GO:  Was the test performed? Yes .  Length of time to ambulate 10 feet: 4 sec.   Gait steady and fast without use of assistive device  Cognitive Function:     6CIT Screen 11/17/2019  What Year? 0 points  What month? 0 points  What time? 0 points  Count back from 20 0 points  Months in reverse 0 points  Repeat phrase 0 points  Total Score 0    Immunizations Immunization History  Administered Date(s) Administered  . Fluad Quad(high Dose 65+) 03/25/2019, 03/30/2020  . Moderna Sars-Covid-2 Vaccination 07/11/2019, 08/08/2019, 05/04/2020, 10/11/2020  . Pneumococcal Conjugate-13 10/24/2016  . Pneumococcal Polysaccharide-23 05/27/2019  . Tdap 10/24/2016  . Zoster Recombinat (Shingrix) 09/12/2020    TDAP status: Up to date  Flu Vaccine status: Up to date  Pneumococcal vaccine status: Up to date  Covid-19 vaccine status: Completed vaccines  Qualifies for Shingles Vaccine? Yes  Zostavax completed No   Shingrix  Completed?: Yes due for second dose  Screening Tests Health Maintenance  Topic Date Due  . OPHTHALMOLOGY EXAM  01/17/2021 (Originally 01/19/2020)  . Hepatitis C Screening  09/11/2021 (Originally 06/30/1969)  . INFLUENZA VACCINE  02/05/2021  . HEMOGLOBIN A1C  03/14/2021  . MAMMOGRAM  04/20/2021  . URINE MICROALBUMIN  05/25/2021  . FOOT EXAM  09/11/2021  . COLONOSCOPY (Pts 45-12yrs Insurance coverage will need to be confirmed)  11/28/2021  . TETANUS/TDAP  10/25/2026  . DEXA SCAN  Completed  . COVID-19 Vaccine  Completed  . PNA vac Low Risk Adult  Completed  . HPV VACCINES  Aged Out    Health Maintenance  There are no preventive care reminders to display for this patient.  Colorectal cancer screening: Type of screening: Colonoscopy. Completed 11/28/16. Repeat every 5 years  Mammogram status: Completed 04/20/20. Repeat every year  Bone Density status: Completed 05/01/16. Results reflect: Bone density results: OSTEOPENIA. Repeat every 2 years.  Lung Cancer Screening: (Low Dose CT Chest recommended if Age 68-80 years, 30 pack-year currently smoking OR have quit w/in 15years.) does not qualify.   Additional Screening:  Hepatitis C Screening: does qualify; postponed  Vision Screening: Recommended annual ophthalmology exams for early detection of glaucoma and other disorders of the eye. Is the patient up to date with their annual eye exam?  Yes  Who is the provider or what is the name of the office in which the patient attends annual eye exams? [place] Screening: Recommended annual dental exams for proper oral hygiene  Community Resource Referral / Chronic Care Management: CRR required this visit?  No   CCM required this visit?  No      Plan:     I have personally reviewed and noted the following in the patient's chart:   . Medical and social history . Use of alcohol, tobacco or illicit drugs  . Current medications and supplements including opioid  prescriptions.  . Functional ability and status . Nutritional status . Physical activity . Advanced directives . List of other physicians . Hospitalizations, surgeries, and ER visits in previous 12 months . Vitals . Screenings to include cognitive, depression, and falls . Referrals and appointments  In addition, I have reviewed and discussed with patient certain preventive protocols, quality metrics, and best practice recommendations. A written personalized care plan for preventive services as well as general preventive health recommendations were provided to patient.     Clemetine Marker, LPN   [id number]   Nurse Notes: none

## 2020-11-28 NOTE — Progress Notes (Signed)
Called and scheduled for June 16th @ 9:00 in [place] for bone density

## 2020-12-21 ENCOUNTER — Other Ambulatory Visit: Payer: Medicare PPO

## 2020-12-28 ENCOUNTER — Other Ambulatory Visit: Payer: Medicare PPO

## 2021-01-18 ENCOUNTER — Other Ambulatory Visit: Payer: Self-pay

## 2021-01-18 ENCOUNTER — Ambulatory Visit
Admission: RE | Admit: 2021-01-18 | Discharge: 2021-01-18 | Disposition: A | Discharge status: Home / Self Care | Payer: Medicare HMO | Source: Ambulatory Visit | Attending: Family Medicine | Admitting: Family Medicine

## 2021-01-18 DIAGNOSIS — Z780 Asymptomatic menopausal state: Secondary | ICD-10-CM | POA: Insufficient documentation

## 2021-01-18 DIAGNOSIS — M85852 Other specified disorders of bone density and structure, left thigh: Secondary | ICD-10-CM | POA: Diagnosis not present

## 2021-01-30 ENCOUNTER — Ambulatory Visit (INDEPENDENT_AMBULATORY_CARE_PROVIDER_SITE_OTHER): Payer: Medicare HMO | Admitting: Family Medicine

## 2021-01-30 ENCOUNTER — Encounter: Payer: Self-pay | Admitting: Family Medicine

## 2021-01-30 ENCOUNTER — Other Ambulatory Visit: Payer: Self-pay

## 2021-01-30 VITALS — BP 130/68 | HR 64 | Ht 63.0 in | Wt 172.0 lb

## 2021-01-30 DIAGNOSIS — L509 Urticaria, unspecified: Secondary | ICD-10-CM | POA: Diagnosis not present

## 2021-01-30 DIAGNOSIS — R7303 Prediabetes: Secondary | ICD-10-CM | POA: Diagnosis not present

## 2021-01-30 MED ORDER — TRIAMCINOLONE ACETONIDE 0.1 % EX CREA: 1.0000 "application " | TOPICAL_CREAM | Freq: Two times a day (BID) | CUTANEOUS | 0 refills | Status: DC

## 2021-01-30 MED ORDER — METFORMIN HCL 500 MG PO TABS: 500.0000 mg | ORAL_TABLET | Freq: Two times a day (BID) | ORAL | 1 refills | Status: DC

## 2021-01-30 NOTE — Progress Notes (Signed)
Date:  01/30/2021   Name:  Wendy Tucker   DOB:  [birth year]   MRN:  [id number]   Chief Complaint: Rash (Red on legs) and Diabetes  Rash This is a new problem. The current episode started in the past 7 days. The problem is unchanged. The affected locations include the right lower leg and left lower leg. The rash is characterized by redness, itchiness and swelling. Pertinent negatives include no anorexia, congestion, cough, diarrhea, eye pain, facial edema, fatigue, fever, joint pain, nail changes, rhinorrhea, shortness of breath, sore throat or vomiting. Past treatments include antihistamine. The treatment provided moderate relief.  Diabetes She presents for her follow-up diabetic visit. She has type 2 diabetes mellitus. The initial diagnosis of diabetes was made 2 days ago. Her disease course has been fluctuating. There are no hypoglycemic associated symptoms. Pertinent negatives for hypoglycemia include no dizziness, headaches or nervousness/anxiousness. There are no diabetic associated symptoms. Pertinent negatives for diabetes include no blurred vision, no chest pain, no fatigue, no foot paresthesias, no foot ulcerations, no polydipsia, no polyphagia, no polyuria, no visual change, no weakness and no weight loss. There are no hypoglycemic complications. Symptoms are stable. There are no diabetic complications. Risk factors for coronary artery disease include diabetes mellitus. Current diabetic treatment includes oral agent (monotherapy). She is following a generally healthy diet. Her breakfast blood glucose range is generally 90-110 mg/dl.   Lab Results  Component Value Date   CREATININE 0.84 09/11/2020   BUN 15 09/11/2020   NA 140 09/11/2020   K 4.3 09/11/2020   CL 104 09/11/2020   CO2 25 09/11/2020   Lab Results  Component Value Date   CHOL 205 (H) 09/11/2020   HDL 76 09/11/2020   LDLCALC 113 (H) 09/11/2020   TRIG 92 09/11/2020   CHOLHDL 3.3 05/27/2019   Lab Results   Component Value Date   TSH 1.100 05/27/2019   Lab Results  Component Value Date   HGBA1C 6.3 (H) 09/11/2020   Lab Results  Component Value Date   WBC 8.4 02/07/2016   HGB 12.9 02/07/2016   HCT 39.0 02/07/2016   MCV 91 02/07/2016   PLT 206 02/07/2016   Lab Results  Component Value Date   ALT 13 02/07/2016   AST 20 02/07/2016   ALKPHOS 80 02/07/2016   BILITOT 0.3 02/07/2016     Review of Systems  Constitutional:  Negative for chills, fatigue, fever and weight loss.  HENT:  Negative for congestion, drooling, ear discharge, ear pain, rhinorrhea and sore throat.   Eyes:  Negative for blurred vision and pain.  Respiratory:  Negative for cough, shortness of breath and wheezing.   Cardiovascular:  Negative for chest pain, palpitations and leg swelling.  Gastrointestinal:  Negative for abdominal pain, anorexia, blood in stool, constipation, diarrhea, nausea and vomiting.  Endocrine: Negative for polydipsia, polyphagia and polyuria.  Genitourinary:  Negative for dysuria, frequency, hematuria and urgency.  Musculoskeletal:  Negative for back pain, joint pain, myalgias and neck pain.  Skin:  Positive for rash. Negative for nail changes.  Allergic/Immunologic: Negative for environmental allergies.  Neurological:  Negative for dizziness, weakness and headaches.  Hematological:  Does not bruise/bleed easily.  Psychiatric/Behavioral:  Negative for suicidal ideas. The patient is not nervous/anxious.    Patient Active Problem List   Diagnosis Date Noted   Special screening for malignant neoplasms, colon    Polyp of sigmoid colon    Benign neoplasm of descending colon    Hyperlipidemia 11/21/2016  Vitamin D deficiency 10/24/2016   Osteopenia 10/24/2016   Diabetes mellitus type 2, controlled, without complications ([place]) [id number]   B12 deficiency 10/24/2016   Obesity (BMI 30.0-34.9) 10/24/2016   FH: Alzheimer's disease 10/24/2016   FH: gastric cancer 10/24/2016   FH: breast  cancer 10/24/2016    Allergies  Allergen Reactions   Cephalexin    Amoxicillin Rash    Past Surgical History:  Procedure Laterality Date   BREAST BIOPSY Bilateral    neg- needle bx/clips   BREAST BIOPSY Right 03/18/2019   affirm bx of calcs medial, coil clip, negative   COLONOSCOPY WITH PROPOFOL N/A 11/28/2016   Procedure: COLONOSCOPY WITH PROPOFOL;  Surgeon: Lucilla Lame, MD;  Location: [place];  Service: Endoscopy;  Laterality: N/A;   ECTOPIC PREGNANCY SURGERY     fractured leg Right 2008   has metal rods   POLYPECTOMY  11/28/2016   Procedure: POLYPECTOMY;  Surgeon: Lucilla Lame, MD;  Location: [place];  Service: Endoscopy;;    Social History   Tobacco Use   Smoking status: Never   Smokeless tobacco: Never  Vaping Use   Vaping Use: Never used  Substance Use Topics   Alcohol use: Yes    Comment: Occasionally, 2 glass wine/month   Drug use: No     Medication list has been reviewed and updated.  Current Meds  Medication Sig   ACCU-CHEK GUIDE test strip    Accu-Chek Softclix Lancets lancets    aspirin 81 MG tablet Take 1 tablet (81 mg total) by mouth daily.   cetirizine (ZYRTEC) 10 MG tablet Take 10 mg by mouth daily.   cholecalciferol (VITAMIN D) 1000 units tablet Take 1,000 Units by mouth daily.   metFORMIN (GLUCOPHAGE) 500 MG tablet Take 1 tablet (500 mg total) by mouth 2 (two) times daily with a meal.   Multiple Minerals-Vitamins (NUTRA-SUPPORT BONE) CAPS Take 1 capsule by mouth daily.   simvastatin (ZOCOR) 10 MG tablet Take 1 tablet (10 mg total) by mouth at bedtime.   vitamin B-12 (CYANOCOBALAMIN) 500 MCG tablet Take 1 tablet (500 mcg total) by mouth daily.    PHQ 2/9 Scores 01/30/2021 11/22/2020 09/11/2020 11/18/2019  PHQ - 2 Score 0 0 0 0  PHQ- 9 Score 0 - 0 0    GAD 7 : Generalized Anxiety Score 01/30/2021 09/11/2020 11/18/2019  Nervous, Anxious, on Edge 0 0 0  Control/stop worrying 0 0 0  Worry too much - different things 0 0 0   Trouble relaxing 0 0 0  Restless 0 0 0  Easily annoyed or irritable 0 0 0  Afraid - awful might happen 0 0 0  Total GAD 7 Score 0 0 0    BP Readings from Last 3 Encounters:  01/30/21 130/68  11/22/20 112/72  09/11/20 136/70    Physical Exam Vitals and nursing note reviewed.  Constitutional:      General: She is not in acute distress.    Appearance: She is not diaphoretic.  HENT:     Head: Normocephalic and atraumatic.     Right Ear: Tympanic membrane, ear canal and external ear normal.     Left Ear: Tympanic membrane, ear canal and external ear normal.     Nose: Nose normal. No congestion or rhinorrhea.     Mouth/Throat:     Mouth: Mucous membranes are moist.  Eyes:     General:        Right eye: No discharge.  Left eye: No discharge.     Conjunctiva/sclera: Conjunctivae normal.     Pupils: Pupils are equal, round, and reactive to light.  Neck:     Thyroid: No thyromegaly.     Vascular: No JVD.  Cardiovascular:     Rate and Rhythm: Normal rate and regular rhythm.     Heart sounds: Normal heart sounds. No murmur heard.   No friction rub. No gallop.  Pulmonary:     Effort: Pulmonary effort is normal.     Breath sounds: Normal breath sounds. No wheezing, rhonchi or rales.  Abdominal:     General: Bowel sounds are normal.     Palpations: Abdomen is soft. There is no mass.     Tenderness: There is no abdominal tenderness. There is no guarding.     Hernia: No hernia is present.  Musculoskeletal:        General: Normal range of motion.     Cervical back: Normal range of motion and neck supple.  Lymphadenopathy:     Cervical: No cervical adenopathy.  Skin:    General: Skin is warm and dry.  Neurological:     Mental Status: She is alert.     Deep Tendon Reflexes: Reflexes are normal and symmetric.    Wt Readings from Last 3 Encounters:  01/30/21 172 lb (78 kg)  11/22/20 176 lb (79.8 kg)  09/11/20 182 lb (82.6 kg)    BP 130/68   Pulse 64   Ht '5\' 3"'$  (1.6  m)   Wt 172 lb (78 kg)   LMP 02/07/1991   BMI 30.47 kg/m   Assessment and Plan:  1. Urticaria New onset.  Uncontrolled.  Uncomplicated.  Patient was grilling and some grass and likely sustained some insect bites with a local reaction.  Patient has taken some Benadryl last night with some results.  Patient also notes swelling but I suspect this was more of venous insufficiency since she did a lot of standing at a funeral.  Patient's been instructed to take a nonsedating antihistamine such as Claritin in the morning with Benadryl at night and has been given triamcinolone 0.1% to apply to each individual areas twice a day. - triamcinolone cream (KENALOG) 0.1 %; Apply 1 application topically 2 (two) times daily.  Dispense: 30 g; Refill: 0  2. Prediabetes Chronic.  Controlled.  Stable.  Blood sugars in the 1 10-1 20 range.  Patient is doing well on current dosing of metformin 500 mg twice a day.  Will check A1c for current level of control. - metFORMIN (GLUCOPHAGE) 500 MG tablet; Take 1 tablet (500 mg total) by mouth 2 (two) times daily with a meal.  Dispense: 180 tablet; Refill: 1 - HgB A1c

## 2021-01-31 ENCOUNTER — Telehealth: Payer: Self-pay

## 2021-01-31 ENCOUNTER — Other Ambulatory Visit: Payer: Self-pay

## 2021-01-31 DIAGNOSIS — L509 Urticaria, unspecified: Secondary | ICD-10-CM

## 2021-01-31 LAB — HEMOGLOBIN A1C
Est. average glucose Bld gHb Est-mCnc: 137 mg/dL
Hgb A1c MFr Bld: 6.4 % — ABNORMAL HIGH (ref 4.8–5.6)

## 2021-01-31 MED ORDER — PREDNISONE 10 MG PO TABS: ORAL_TABLET | ORAL | 0 refills | Status: DC

## 2021-01-31 NOTE — Telephone Encounter (Signed)
Copied from [place] [id number]. Topic: General - Other >> Jan 31, 2021 11:04 AM Leward Quan A wrote: Reason for CRM: Patient called in asking to speak to Dr Ronnald Ramp nurse about her visit on 01/30/21. Say that there is something she need to discuss with the nurse  Please call Ph# [phone]

## 2021-02-15 DIAGNOSIS — H40003 Preglaucoma, unspecified, bilateral: Secondary | ICD-10-CM | POA: Diagnosis not present

## 2021-02-15 DIAGNOSIS — Z0100 Encounter for examination of eyes and vision without abnormal findings: Secondary | ICD-10-CM | POA: Diagnosis not present

## 2021-02-15 LAB — HM DIABETES EYE EXAM

## 2021-03-13 ENCOUNTER — Telehealth: Payer: Self-pay

## 2021-03-13 ENCOUNTER — Other Ambulatory Visit: Payer: Self-pay | Admitting: *Deleted

## 2021-03-13 DIAGNOSIS — Z1231 Encounter for screening mammogram for malignant neoplasm of breast: Secondary | ICD-10-CM

## 2021-03-13 NOTE — Telephone Encounter (Signed)
Copied from [place] [id number]. Topic: Appointment Scheduling - Scheduling Inquiry for Clinic >> Mar 13, 2021  2:49 PM Erick Blinks wrote: Reason for CRM: Pt called and is requesting to have a mammogram ordered Best contact: [phone]

## 2021-03-14 ENCOUNTER — Other Ambulatory Visit: Payer: Self-pay

## 2021-03-14 DIAGNOSIS — Z1231 Encounter for screening mammogram for malignant neoplasm of breast: Secondary | ICD-10-CM

## 2021-03-14 NOTE — Progress Notes (Signed)
Ordered mammo 

## 2021-05-03 ENCOUNTER — Ambulatory Visit
Admission: RE | Admit: 2021-05-03 | Discharge: 2021-05-03 | Disposition: A | Discharge status: Home / Self Care | Payer: Medicare HMO | Source: Ambulatory Visit | Attending: Family Medicine | Admitting: Family Medicine

## 2021-05-03 ENCOUNTER — Other Ambulatory Visit: Payer: Self-pay

## 2021-05-03 DIAGNOSIS — Z1231 Encounter for screening mammogram for malignant neoplasm of breast: Secondary | ICD-10-CM | POA: Insufficient documentation

## 2021-05-17 ENCOUNTER — Other Ambulatory Visit: Payer: Self-pay | Admitting: Family Medicine

## 2021-05-17 DIAGNOSIS — E7801 Familial hypercholesterolemia: Secondary | ICD-10-CM

## 2021-05-17 MED ORDER — ACCU-CHEK SOFTCLIX LANCETS MISC: 0 refills | Status: AC

## 2021-05-17 MED ORDER — SIMVASTATIN 10 MG PO TABS: 10.0000 mg | ORAL_TABLET | Freq: Every day | ORAL | 0 refills | Status: DC

## 2021-05-17 NOTE — Telephone Encounter (Signed)
Requested medication (s) are due for refill today: yes  Requested medication (s) are on the active medication list: yes  Last refill:  09/12/2020  Future visit scheduled: yes  Notes to clinic:  historical med. Please advise     Requested Prescriptions  Pending Prescriptions Disp Refills   Accu-Chek Softclix Lancets lancets 100 each      Endocrinology: Diabetes - Testing Supplies Passed - 05/17/2021 10:13 AM      Passed - Valid encounter within last 12 months    Recent Outpatient Visits           3 months ago [facility] Juline Patch, MD   8 months ago Prediabetes   [facility] Juline Patch, MD   11 months ago Prediabetes   [facility] Juline Patch, MD   1 year ago Prediabetes   [facility] Juline Patch, MD   1 year ago Familial hypercholesterolemia   [facility] Juline Patch, MD       Future Appointments             In 2 weeks Juline Patch, MD [facility]            Signed Prescriptions Disp Refills   simvastatin (ZOCOR) 10 MG tablet 90 tablet 0    Sig: Take 1 tablet (10 mg total) by mouth at bedtime.     Cardiovascular:  Antilipid - Statins Failed - 05/17/2021 10:13 AM      Failed - Total Cholesterol in normal range and within 360 days    Cholesterol, Total  Date Value Ref Range Status  09/11/2020 205 (H) 100 - 199 mg/dL Final          Failed - LDL in normal range and within 360 days    LDL Chol Calc (NIH)  Date Value Ref Range Status  09/11/2020 113 (H) 0 - 99 mg/dL Final          Passed - HDL in normal range and within 360 days    HDL  Date Value Ref Range Status  09/11/2020 76 >39 mg/dL Final          Passed - Triglycerides in normal range and within 360 days    Triglycerides  Date Value Ref Range Status  09/11/2020 92 0 - 149 mg/dL Final          Passed - Patient is not pregnant      Passed - Valid encounter within last 12 months    Recent  Outpatient Visits           3 months ago Urticaria   [place], MD   8 months ago Prediabetes   [facility] Juline Patch, MD   11 months ago Prediabetes   [facility] Juline Patch, MD   1 year ago Prediabetes   [facility] Juline Patch, MD   1 year ago Familial hypercholesterolemia   [place], Deanna C, MD       Future Appointments             In 2 weeks Juline Patch, MD [facility], [facility]

## 2021-05-17 NOTE — Telephone Encounter (Signed)
Requested Prescriptions  Pending Prescriptions Disp Refills  . Accu-Chek Softclix Lancets lancets 100 each      Endocrinology: Diabetes - Testing Supplies Passed - 05/17/2021 10:13 AM      Passed - Valid encounter within last 12 months    Recent Outpatient Visits          3 months ago [place], MD   8 months ago Prediabetes   [facility] Juline Patch, MD   11 months ago Prediabetes   [facility] Juline Patch, MD   1 year ago Prediabetes   [facility] Juline Patch, MD   1 year ago Familial hypercholesterolemia   [place], [place]      Future Appointments            In 2 weeks Juline Patch, MD [facility], [place]           . simvastatin (ZOCOR) 10 MG tablet 90 tablet 1    Sig: Take 1 tablet (10 mg total) by mouth at bedtime.     Cardiovascular:  Antilipid - Statins Failed - 05/17/2021 10:13 AM      Failed - Total Cholesterol in normal range and within 360 days    Cholesterol, Total  Date Value Ref Range Status  09/11/2020 205 (H) 100 - 199 mg/dL Final         Failed - LDL in normal range and within 360 days    LDL Chol Calc (NIH)  Date Value Ref Range Status  09/11/2020 113 (H) 0 - 99 mg/dL Final         Passed - HDL in normal range and within 360 days    HDL  Date Value Ref Range Status  09/11/2020 76 >39 mg/dL Final         Passed - Triglycerides in normal range and within 360 days    Triglycerides  Date Value Ref Range Status  09/11/2020 92 0 - 149 mg/dL Final         Passed - Patient is not pregnant      Passed - Valid encounter within last 12 months    Recent Outpatient Visits          3 months ago Urticaria   [place], MD   8 months ago Prediabetes   [facility] Juline Patch, MD   11 months ago Prediabetes   [facility] Juline Patch, MD   1 year ago Prediabetes   [facility] Juline Patch, MD   1 year ago Familial hypercholesterolemia   [place], Deanna C, MD      Future Appointments            In 2 weeks Juline Patch, MD [facility], [facility]

## 2021-05-17 NOTE — Telephone Encounter (Signed)
Pt called in to request a refill for 2 prescriptions.   Accu-Chek Softclix Lancets lancets  simvastatin (ZOCOR) 10 MG tablet    Pharmacy:    [id number] - [place], [place] [address] AT [place]   Last ov: 01/30/21 Next ov : 06/04/21   Pt says that she has reached out to her pharmacy also to request, not showing request in chart.

## 2021-06-04 ENCOUNTER — Ambulatory Visit: Payer: Medicare HMO | Admitting: Family Medicine

## 2021-06-15 ENCOUNTER — Other Ambulatory Visit: Payer: Self-pay

## 2021-06-15 ENCOUNTER — Ambulatory Visit (INDEPENDENT_AMBULATORY_CARE_PROVIDER_SITE_OTHER): Payer: Medicare HMO | Admitting: Family Medicine

## 2021-06-15 ENCOUNTER — Encounter: Payer: Self-pay | Admitting: Family Medicine

## 2021-06-15 VITALS — BP 110/62 | HR 80 | Ht 63.0 in | Wt 171.0 lb

## 2021-06-15 DIAGNOSIS — R7303 Prediabetes: Secondary | ICD-10-CM | POA: Diagnosis not present

## 2021-06-15 DIAGNOSIS — E7801 Familial hypercholesterolemia: Secondary | ICD-10-CM

## 2021-06-15 MED ORDER — SIMVASTATIN 10 MG PO TABS: 10.0000 mg | ORAL_TABLET | Freq: Every day | ORAL | 0 refills | Status: DC

## 2021-06-15 MED ORDER — METFORMIN HCL 500 MG PO TABS: 500.0000 mg | ORAL_TABLET | Freq: Two times a day (BID) | ORAL | 1 refills | Status: DC

## 2021-06-15 NOTE — Progress Notes (Signed)
Date:  06/15/2021   Name:  Wendy Tucker   DOB:  1950-10-21   MRN:  [id number]   Chief Complaint: Hyperlipidemia and Prediabetes  Hyperlipidemia This is a chronic problem. The current episode started more than 1 year ago. The problem is controlled. Recent lipid tests were reviewed and are normal. Exacerbating diseases include diabetes. She has no history of chronic renal disease. There are no known factors aggravating her hyperlipidemia. Pertinent negatives include no chest pain, focal sensory loss, focal weakness, leg pain, myalgias or shortness of breath. Current antihyperlipidemic treatment includes statins. The current treatment provides moderate improvement of lipids. There are no compliance problems.  Risk factors for coronary artery disease include hypertension.  Diabetes She has type 2 diabetes mellitus. Her disease course has been stable. There are no hypoglycemic associated symptoms. Pertinent negatives for diabetes include no blurred vision, no chest pain, no fatigue, no foot paresthesias, no polydipsia, no polyuria, no weakness and no weight loss. There are no hypoglycemic complications. Symptoms are stable. There are no diabetic complications. Current diabetic treatment includes oral agent (monotherapy). Her weight is stable. She is following a generally healthy diet. Meal planning includes avoidance of concentrated sweets and carbohydrate counting. Her breakfast blood glucose range is generally 90-110 mg/dl. An ACE inhibitor/angiotensin II receptor blocker is not being taken.   Lab Results  Component Value Date   NA 140 09/11/2020   K 4.3 09/11/2020   CO2 25 09/11/2020   GLUCOSE 89 09/11/2020   BUN 15 09/11/2020   CREATININE 0.84 09/11/2020   CALCIUM 9.8 09/11/2020   EGFR 75 09/11/2020   GFRNONAA 59 (L) 05/25/2020   Lab Results  Component Value Date   CHOL 205 (H) 09/11/2020   HDL 76 09/11/2020   LDLCALC 113 (H) 09/11/2020   TRIG 92 09/11/2020   CHOLHDL 3.3  05/27/2019   Lab Results  Component Value Date   TSH 1.100 05/27/2019   Lab Results  Component Value Date   HGBA1C 6.4 (H) 01/30/2021   Lab Results  Component Value Date   WBC 8.4 02/07/2016   HGB 12.9 02/07/2016   HCT 39.0 02/07/2016   MCV 91 02/07/2016   PLT 206 02/07/2016   Lab Results  Component Value Date   ALT 13 02/07/2016   AST 20 02/07/2016   ALKPHOS 80 02/07/2016   BILITOT 0.3 02/07/2016   Lab Results  Component Value Date   VD25OH 31.9 10/24/2016     Review of Systems  Constitutional:  Negative for fatigue, unexpected weight change and weight loss.  Eyes:  Negative for blurred vision.  Respiratory:  Negative for shortness of breath.   Cardiovascular:  Negative for chest pain.  Endocrine: Negative for polydipsia and polyuria.  Musculoskeletal:  Negative for myalgias.  Neurological:  Negative for focal weakness and weakness.  Hematological:  Negative for adenopathy.   Patient Active Problem List   Diagnosis Date Noted   Special screening for malignant neoplasms, colon    Polyp of sigmoid colon    Benign neoplasm of descending colon    Hyperlipidemia 11/21/2016   Vitamin D deficiency 10/24/2016   Osteopenia 10/24/2016   Diabetes mellitus type 2, controlled, without complications ([place]) 73/53/2992   B12 deficiency 10/24/2016   Obesity (BMI 30.0-34.9) 10/24/2016   FH: Alzheimer's disease 10/24/2016   FH: gastric cancer 10/24/2016   FH: breast cancer 10/24/2016    Allergies  Allergen Reactions   Cephalexin    Amoxicillin Rash    Past Surgical History:  Procedure Laterality Date   BREAST BIOPSY Bilateral    neg- needle bx/clips   BREAST BIOPSY Right 03/18/2019   affirm bx of calcs medial, coil clip, negative   COLONOSCOPY WITH PROPOFOL N/A 11/28/2016   Procedure: COLONOSCOPY WITH PROPOFOL;  Surgeon: Lucilla Lame, MD;  Location: [facility];  Service: Endoscopy;  Laterality: N/A;   ECTOPIC PREGNANCY SURGERY     fractured leg Right 2008    has metal rods   POLYPECTOMY  11/28/2016   Procedure: POLYPECTOMY;  Surgeon: Lucilla Lame, MD;  Location: [facility];  Service: Endoscopy;;    Social History   Tobacco Use   Smoking status: Never   Smokeless tobacco: Never  Vaping Use   Vaping Use: Never used  Substance Use Topics   Alcohol use: Yes    Comment: Occasionally, 2 glass wine/month   Drug use: No     Medication list has been reviewed and updated.  Current Meds  Medication Sig   ACCU-CHEK GUIDE test strip    Accu-Chek Softclix Lancets lancets Check BS 1 time daily   aspirin 81 MG tablet Take 1 tablet (81 mg total) by mouth daily.   cetirizine (ZYRTEC) 10 MG tablet Take 10 mg by mouth daily.   cholecalciferol (VITAMIN D) 1000 units tablet Take 1,000 Units by mouth daily.   metFORMIN (GLUCOPHAGE) 500 MG tablet Take 1 tablet (500 mg total) by mouth 2 (two) times daily with a meal.   Multiple Minerals-Vitamins (NUTRA-SUPPORT BONE) CAPS Take 1 capsule by mouth daily.   simvastatin (ZOCOR) 10 MG tablet Take 1 tablet (10 mg total) by mouth at bedtime.   triamcinolone cream (KENALOG) 0.1 % Apply 1 application topically 2 (two) times daily.   vitamin B-12 (CYANOCOBALAMIN) 500 MCG tablet Take 1 tablet (500 mcg total) by mouth daily.    PHQ 2/9 Scores 01/30/2021 11/22/2020 09/11/2020 11/18/2019  PHQ - 2 Score 0 0 0 0  PHQ- 9 Score 0 - 0 0    GAD 7 : Generalized Anxiety Score 01/30/2021 09/11/2020 11/18/2019  Nervous, Anxious, on Edge 0 0 0  Control/stop worrying 0 0 0  Worry too much - different things 0 0 0  Trouble relaxing 0 0 0  Restless 0 0 0  Easily annoyed or irritable 0 0 0  Afraid - awful might happen 0 0 0  Total GAD 7 Score 0 0 0    BP Readings from Last 3 Encounters:  06/15/21 110/62  01/30/21 130/68  11/22/20 112/72    Physical Exam Vitals and nursing note reviewed.  Constitutional:      General: She is not in acute distress.    Appearance: She is not diaphoretic.  HENT:     Head:  Normocephalic and atraumatic.     Right Ear: Tympanic membrane, ear canal and external ear normal.     Left Ear: Tympanic membrane, ear canal and external ear normal.     Nose: Nose normal. No congestion or rhinorrhea.  Eyes:     General:        Right eye: No discharge.        Left eye: No discharge.     Conjunctiva/sclera: Conjunctivae normal.     Pupils: Pupils are equal, round, and reactive to light.  Neck:     Thyroid: No thyromegaly.     Vascular: No JVD.  Cardiovascular:     Rate and Rhythm: Normal rate and regular rhythm.     Heart sounds: Normal heart sounds. No murmur  heard.   No friction rub. No gallop.  Pulmonary:     Effort: Pulmonary effort is normal.     Breath sounds: Normal breath sounds. No wheezing or rhonchi.  Abdominal:     General: Bowel sounds are normal.     Palpations: Abdomen is soft. There is no mass.     Tenderness: There is no abdominal tenderness. There is no guarding.  Musculoskeletal:        General: Normal range of motion.     Cervical back: Normal range of motion and neck supple.  Lymphadenopathy:     Cervical: No cervical adenopathy.  Skin:    General: Skin is warm and dry.  Neurological:     Mental Status: She is alert.     Deep Tendon Reflexes: Reflexes are normal and symmetric.    Wt Readings from Last 3 Encounters:  06/15/21 171 lb (77.6 kg)  01/30/21 172 lb (78 kg)  11/22/20 176 lb (79.8 kg)    BP 110/62   Pulse 80   Ht _0  (1.6 m)   Wt 171 lb (77.6 kg)   LMP 02/07/1991   BMI 30.29 kg/m   Assessment and Plan:  1. Prediabetes Chronic.  Controlled.  Stable.  Uncomplicated.  Currently is on metformin 500 mg twice a day and A1c has been in normal range.  Fasting blood sugars in the high 90 range.  We will repeat A1c continue metformin at current dosing and check CMP and microalbuminuria. - metFORMIN (GLUCOPHAGE) 500 MG tablet; Take 1 tablet (500 mg total) by mouth 2 (two) times daily with a meal.  Dispense: 180 tablet; Refill:  1 - HgB A1c - Comprehensive Metabolic Panel (CMET) - Microalbumin, urine  2. Familial hypercholesterolemia Chronic.  Controlled.  Stable.  Uncomplicated.  Reemphasize low-cholesterol diet.  We will continue simvastatin 10 mg once a day.  Will check lipid panel for current level of LDL. - simvastatin (ZOCOR) 10 MG tablet; Take 1 tablet (10 mg total) by mouth at bedtime.  Dispense: 90 tablet; Refill: 0 - Lipid Panel With LDL/HDL Ratio

## 2021-06-16 LAB — COMPREHENSIVE METABOLIC PANEL
ALT: 11 IU/L (ref 0–32)
AST: 24 IU/L (ref 0–40)
Albumin/Globulin Ratio: 1.9 (ref 1.2–2.2)
Albumin: 4.7 g/dL (ref 3.8–4.8)
Alkaline Phosphatase: 74 IU/L (ref 44–121)
BUN/Creatinine Ratio: 15 (ref 12–28)
BUN: 14 mg/dL (ref 8–27)
Bilirubin Total: 0.6 mg/dL (ref 0.0–1.2)
CO2: 25 mmol/L (ref 20–29)
Calcium: 9.8 mg/dL (ref 8.7–10.3)
Chloride: 102 mmol/L (ref 96–106)
Creatinine, Ser: 0.91 mg/dL (ref 0.57–1.00)
Globulin, Total: 2.5 g/dL (ref 1.5–4.5)
Glucose: 110 mg/dL — ABNORMAL HIGH (ref 70–99)
Potassium: 4.1 mmol/L (ref 3.5–5.2)
Sodium: 141 mmol/L (ref 134–144)
Total Protein: 7.2 g/dL (ref 6.0–8.5)
eGFR: 68 mL/min/{1.73_m2} (ref >59)

## 2021-06-16 LAB — MICROALBUMIN, URINE: Microalbumin, Urine: 7 ug/mL

## 2021-06-16 LAB — LIPID PANEL WITH LDL/HDL RATIO
Cholesterol, Total: 195 mg/dL (ref 100–199)
HDL: 79 mg/dL (ref >39)
LDL Chol Calc (NIH): 104 mg/dL — ABNORMAL HIGH (ref 0–99)
LDL/HDL Ratio: 1.3 ratio (ref 0.0–3.2)
Triglycerides: 65 mg/dL (ref 0–149)
VLDL Cholesterol Cal: 12 mg/dL (ref 5–40)

## 2021-06-16 LAB — HEMOGLOBIN A1C
Est. average glucose Bld gHb Est-mCnc: 126 mg/dL
Hgb A1c MFr Bld: 6 % — ABNORMAL HIGH (ref 4.8–5.6)

## 2021-08-06 ENCOUNTER — Other Ambulatory Visit: Payer: Self-pay | Admitting: Family Medicine

## 2021-08-06 DIAGNOSIS — R7303 Prediabetes: Secondary | ICD-10-CM

## 2021-08-16 DIAGNOSIS — H40003 Preglaucoma, unspecified, bilateral: Secondary | ICD-10-CM | POA: Diagnosis not present

## 2021-09-12 ENCOUNTER — Other Ambulatory Visit: Payer: Self-pay | Admitting: Family Medicine

## 2021-09-17 ENCOUNTER — Telehealth: Payer: Self-pay

## 2021-09-17 ENCOUNTER — Other Ambulatory Visit: Payer: Self-pay

## 2021-09-17 DIAGNOSIS — Z86010 Personal history of colonic polyps: Secondary | ICD-10-CM

## 2021-09-17 MED ORDER — NA SULFATE-K SULFATE-MG SULF 17.5-3.13-1.6 GM/177ML PO SOLN: 1.0000 | Freq: Once | ORAL | 0 refills | Status: AC

## 2021-09-17 NOTE — Telephone Encounter (Signed)
Gastroenterology Pre-Procedure Review ? ?Request Date: 11/08/21 ?Requesting Physician: Dr. Allen Norris ? ?PATIENT REVIEW QUESTIONS: The patient responded to the following health history questions as indicated:   ? ?1. Are you having any GI issues? no ?2. Do you have a personal history of Polyps? yes (2018) ?3. Do you have a family history of Colon Cancer or Polyps? no ?4. Diabetes Mellitus? no ?5. Joint replacements in the past 12 months?no ?6. Major health problems in the past 3 months?no ?7. Any artificial heart valves, MVP, or defibrillator?no ?   ?MEDICATIONS & ALLERGIES:    ?Patient reports the following regarding taking any anticoagulation/antiplatelet therapy:   ?Plavix, Coumadin, Eliquis, Xarelto, Lovenox, Pradaxa, Brilinta, or Effient? no ?Aspirin? yes (ASA'81mg'$ ) ? ?Patient confirms/reports the following medications:  ?Current Outpatient Medications  ?Medication Sig Dispense Refill  ? Accu-Chek Softclix Lancets lancets Check BS 1 time daily 100 each 0  ? aspirin 81 MG tablet Take 1 tablet (81 mg total) by mouth daily. 30 tablet   ? cetirizine (ZYRTEC) 10 MG tablet Take 10 mg by mouth daily.    ? cholecalciferol (VITAMIN D) 1000 units tablet Take 1,000 Units by mouth daily.    ? glucose blood (ACCU-CHEK GUIDE) test strip TEST ONCE DAILY 50 strip 3  ? metFORMIN (GLUCOPHAGE) 500 MG tablet TAKE 1 TABLET(500 MG) BY MOUTH TWICE DAILY WITH A MEAL 180 tablet 0  ? Multiple Minerals-Vitamins (NUTRA-SUPPORT BONE) CAPS Take 1 capsule by mouth daily.    ? simvastatin (ZOCOR) 10 MG tablet Take 1 tablet (10 mg total) by mouth at bedtime. 90 tablet 0  ? triamcinolone cream (KENALOG) 0.1 % Apply 1 application topically 2 (two) times daily. 30 g 0  ? vitamin B-12 (CYANOCOBALAMIN) 500 MCG tablet Take 1 tablet (500 mcg total) by mouth daily.    ? ?No current facility-administered medications for this visit.  ? ? ?Patient confirms/reports the following allergies:  ?Allergies  ?Allergen Reactions  ? Cephalexin   ? Amoxicillin Rash   ? ? ?No orders of the defined types were placed in this encounter. ? ? ?AUTHORIZATION INFORMATION ?Primary Insurance: ?1D#: ?Group #: ? ?Secondary Insurance: ?1D#: ?Group #: ? ?SCHEDULE INFORMATION: ?Date: 11/08/21 ?Time: ?Location: [place] ?

## 2021-11-02 ENCOUNTER — Other Ambulatory Visit: Payer: Self-pay | Admitting: Family Medicine

## 2021-11-02 DIAGNOSIS — E7801 Familial hypercholesterolemia: Secondary | ICD-10-CM

## 2021-11-02 NOTE — Telephone Encounter (Signed)
Medication Refill - Medication: simvastatin (ZOCOR) 10 MG tablet ? ?Has the patient contacted their pharmacy? No. ?[facility] is calling for the pt's refill if appropriate ? ?Preferred Pharmacy (with phone number or street name): [id number] - [place], [place] [address] AT [place]  ? ?Has the patient been seen for an appointment in the last year OR does the patient have an upcoming appointment? Yes.   ? ?Agent: Please be advised that RX refills may take up to 3 business days. We ask that you follow-up with your pharmacy. ? ?

## 2021-11-05 MED ORDER — SIMVASTATIN 10 MG PO TABS: 10.0000 mg | ORAL_TABLET | Freq: Every day | ORAL | 0 refills | Status: DC

## 2021-11-05 NOTE — Telephone Encounter (Signed)
Requested Prescriptions  ?Pending Prescriptions Disp Refills  ?? simvastatin (ZOCOR) 10 MG tablet 90 tablet 0  ?  Sig: Take 1 tablet (10 mg total) by mouth at bedtime.  ?  ? Cardiovascular:  Antilipid - Statins Failed - 11/02/2021  4:27 PM  ?  ?  Failed - Lipid Panel in normal range within the last 12 months  ?  Cholesterol, Total  ?Date Value Ref Range Status  ?06/15/2021 195 100 - 199 mg/dL Final  ? ?LDL Chol Calc (NIH)  ?Date Value Ref Range Status  ?06/15/2021 104 (H) 0 - 99 mg/dL Final  ? ?HDL  ?Date Value Ref Range Status  ?06/15/2021 79 >39 mg/dL Final  ? ?Triglycerides  ?Date Value Ref Range Status  ?06/15/2021 65 0 - 149 mg/dL Final  ? ?  ?  ?  Passed - Patient is not pregnant  ?  ?  Passed - Valid encounter within last 12 months  ?  Recent Outpatient Visits   ?      ? 4 months ago Prediabetes  ? [facility] Juline Patch, MD  ? 9 months ago Urticaria  ? [facility] Juline Patch, MD  ? 1 year ago Prediabetes  ? [facility] Juline Patch, MD  ? 1 year ago Prediabetes  ? [facility] Juline Patch, MD  ? 1 year ago Prediabetes  ? [facility] Juline Patch, MD  ?  ?  ?Future Appointments   ?        ? In 1 month Juline Patch, MD [facility], [place]  ?  ? ?  ?  ?  ? ?

## 2021-11-07 ENCOUNTER — Encounter: Payer: Self-pay | Admitting: Gastroenterology

## 2021-11-15 ENCOUNTER — Ambulatory Visit
Admission: RE | Admit: 2021-11-15 | Discharge: 2021-11-15 | Disposition: A | Discharge status: Home / Self Care | Payer: No Typology Code available for payment source | Source: Ambulatory Visit | Attending: Gastroenterology | Admitting: Gastroenterology

## 2021-11-15 ENCOUNTER — Encounter
Admission: RE | Disposition: A | Discharge status: Home / Self Care | Payer: Self-pay | Source: Ambulatory Visit | Attending: Gastroenterology

## 2021-11-15 ENCOUNTER — Ambulatory Visit: Payer: No Typology Code available for payment source | Admitting: Anesthesiology

## 2021-11-15 ENCOUNTER — Other Ambulatory Visit: Payer: Self-pay

## 2021-11-15 ENCOUNTER — Encounter: Payer: Self-pay | Admitting: Gastroenterology

## 2021-11-15 DIAGNOSIS — Z1211 Encounter for screening for malignant neoplasm of colon: Secondary | ICD-10-CM | POA: Insufficient documentation

## 2021-11-15 DIAGNOSIS — E559 Vitamin D deficiency, unspecified: Secondary | ICD-10-CM | POA: Insufficient documentation

## 2021-11-15 DIAGNOSIS — K5730 Diverticulosis of large intestine without perforation or abscess without bleeding: Secondary | ICD-10-CM | POA: Diagnosis not present

## 2021-11-15 DIAGNOSIS — K648 Other hemorrhoids: Secondary | ICD-10-CM | POA: Diagnosis not present

## 2021-11-15 DIAGNOSIS — Z860100 Personal history of colon polyps, unspecified: Secondary | ICD-10-CM

## 2021-11-15 DIAGNOSIS — Z7984 Long term (current) use of oral hypoglycemic drugs: Secondary | ICD-10-CM | POA: Insufficient documentation

## 2021-11-15 DIAGNOSIS — E119 Type 2 diabetes mellitus without complications: Secondary | ICD-10-CM | POA: Insufficient documentation

## 2021-11-15 DIAGNOSIS — K635 Polyp of colon: Secondary | ICD-10-CM | POA: Diagnosis not present

## 2021-11-15 DIAGNOSIS — D120 Benign neoplasm of cecum: Secondary | ICD-10-CM | POA: Insufficient documentation

## 2021-11-15 DIAGNOSIS — M8580 Other specified disorders of bone density and structure, unspecified site: Secondary | ICD-10-CM | POA: Insufficient documentation

## 2021-11-15 HISTORY — DX: Hyperlipidemia, unspecified: E78.5

## 2021-11-15 HISTORY — PX: POLYPECTOMY: SHX5525

## 2021-11-15 HISTORY — DX: Type 2 diabetes mellitus without complications: E11.9

## 2021-11-15 HISTORY — PX: COLONOSCOPY WITH PROPOFOL: SHX5780

## 2021-11-15 LAB — GLUCOSE, CAPILLARY
Glucose-Capillary: 116 mg/dL — ABNORMAL HIGH (ref 70–99)
Glucose-Capillary: 114 mg/dL — ABNORMAL HIGH (ref 70–99)

## 2021-11-15 SURGERY — COLONOSCOPY WITH PROPOFOL
Anesthesia: General | Site: Rectum

## 2021-11-15 MED ORDER — LACTATED RINGERS IV SOLN: INTRAVENOUS | Status: DC

## 2021-11-15 MED ORDER — STERILE WATER FOR IRRIGATION IR SOLN
Status: DC | PRN
  Administered 2021-11-15: 100 mL

## 2021-11-15 MED ORDER — ACETAMINOPHEN 325 MG PO TABS: 325.0000 mg | ORAL_TABLET | ORAL | Status: DC | PRN

## 2021-11-15 MED ORDER — SODIUM CHLORIDE 0.9 % IV SOLN: INTRAVENOUS | Status: DC

## 2021-11-15 MED ORDER — ONDANSETRON HCL 4 MG/2ML IJ SOLN: 4.0000 mg | Freq: Once | INTRAMUSCULAR | Status: DC | PRN

## 2021-11-15 MED ORDER — ACETAMINOPHEN 160 MG/5ML PO SOLN: 325.0000 mg | ORAL | Status: DC | PRN

## 2021-11-15 MED ORDER — PROPOFOL 10 MG/ML IV BOLUS
INTRAVENOUS | Status: DC | PRN
  Administered 2021-11-15 (×2): 30 mg via INTRAVENOUS
  Administered 2021-11-15: 90 mg via INTRAVENOUS
  Administered 2021-11-15 (×4): 30 mg via INTRAVENOUS

## 2021-11-15 MED ORDER — LIDOCAINE HCL (CARDIAC) PF 100 MG/5ML IV SOSY
PREFILLED_SYRINGE | INTRAVENOUS | Status: DC | PRN
Start: 2021-11-15 — End: 2021-11-15
  Administered 2021-11-15: 30 mg via INTRAVENOUS

## 2021-11-15 SURGICAL SUPPLY — 8 items
GOWN CVR UNV OPN BCK APRN NK (MISCELLANEOUS) ×2 IMPLANT
GOWN ISOL THUMB LOOP REG UNIV (MISCELLANEOUS) ×4
KIT PRC NS LF DISP ENDO (KITS) ×1 IMPLANT
KIT PROCEDURE OLYMPUS (KITS) ×2
MANIFOLD NEPTUNE II (INSTRUMENTS) ×2 IMPLANT
SNARE COLD EXACTO (MISCELLANEOUS) ×1 IMPLANT
TRAP ETRAP POLY (MISCELLANEOUS) ×1 IMPLANT
WATER STERILE IRR 250ML POUR (IV SOLUTION) ×2 IMPLANT

## 2021-11-15 NOTE — H&P (Signed)
? ?Lucilla Lame, MD [facility] ?[address] ?[place], [place] [address] ?Phone:[phone] ?Fax : [phone] ? ?Primary Care Physician:  Juline Patch, MD ?Primary Gastroenterologist:  Dr. Allen Norris ? ?Pre-Procedure History & Physical: ?HPI:  Wendy Tucker is a 71 y.o. female is here for an colonoscopy. ?  ?Past Medical History:  ?Diagnosis Date  ? Diabetes mellitus without complication ([place])   ? Eye infection, right   ? Hyperlipidemia   ? Vertigo   ? x1, over 20 yrs ago  ? ? ?Past Surgical History:  ?Procedure Laterality Date  ? BREAST BIOPSY Bilateral   ? neg- needle bx/clips  ? BREAST BIOPSY Right 03/18/2019  ? affirm bx of calcs medial, coil clip, negative  ? COLONOSCOPY WITH PROPOFOL N/A 11/28/2016  ? Procedure: COLONOSCOPY WITH PROPOFOL;  Surgeon: Lucilla Lame, MD;  Location: [place];  Service: Endoscopy;  Laterality: N/A;  ? ECTOPIC PREGNANCY SURGERY    ? fractured leg Right 2008  ? has metal rods  ? POLYPECTOMY  11/28/2016  ? Procedure: POLYPECTOMY;  Surgeon: Lucilla Lame, MD;  Location: [facility];  Service: Endoscopy;;  ? ? ?Prior to Admission medications   ?Medication Sig Start Date End Date Taking? Authorizing Provider  ?aspirin 81 MG tablet Take 1 tablet (81 mg total) by mouth daily. 05/07/18  Yes Juline Patch, MD  ?cetirizine (ZYRTEC) 10 MG tablet Take 10 mg by mouth daily.   Yes [provider]  ?cholecalciferol (VITAMIN D) 1000 units tablet Take 1,000 Units by mouth daily.   Yes [provider]  ?glucose blood (ACCU-CHEK GUIDE) test strip TEST ONCE DAILY 09/13/21  Yes Juline Patch, MD  ?metFORMIN (GLUCOPHAGE) 500 MG tablet TAKE 1 TABLET(500 MG) BY MOUTH TWICE DAILY WITH A MEAL 08/06/21  Yes Juline Patch, MD  ?Multiple Minerals-Vitamins (NUTRA-SUPPORT BONE) CAPS Take 1 capsule by mouth daily. 10/28/16  Yes Plonk, Gwyndolyn Saxon, MD  ?simvastatin (ZOCOR) 10 MG tablet Take 1 tablet (10 mg total) by mouth at bedtime. 11/05/21  Yes Juline Patch, MD  ?vitamin B-12  (CYANOCOBALAMIN) 500 MCG tablet Take 1 tablet (500 mcg total) by mouth daily. 05/07/18  Yes Juline Patch, MD  ?Accu-Chek Softclix Lancets lancets Check BS 1 time daily 05/17/21   Juline Patch, MD  ?triamcinolone cream (KENALOG) 0.1 % Apply 1 application topically 2 (two) times daily. 01/30/21   Juline Patch, MD  ? ? ?Allergies as of 09/17/2021 - Review Complete 06/15/2021  ?Allergen Reaction Noted  ? Cephalexin  10/30/2017  ? Amoxicillin Rash 11/19/2016  ? ? ?Family History  ?Problem Relation Age of Onset  ? Hypertension Mother   ? Stomach cancer Father   ? Breast cancer Sister 38  ? Bone cancer Sister   ? ? ?Social History  ? ?Socioeconomic History  ? Marital status: Single  ?  Spouse name: Not on file  ? Number of children: 0  ? Years of education: Not on file  ? Highest education level: Not on file  ?Occupational History  ? Not on file  ?Tobacco Use  ? Smoking status: Never  ? Smokeless tobacco: Never  ?Vaping Use  ? Vaping Use: Never used  ?Substance and Sexual Activity  ? Alcohol use: Yes  ?  Comment: Occasionally, 2 glass wine/month  ? Drug use: No  ? Sexual activity: Yes  ?  Birth control/protection: Post-menopausal  ?Other Topics Concern  ? Not on file  ?Social History Narrative  ? Pt lives alone  ? ?  Social Determinants of Health  ? ?Financial Resource Strain: Low Risk   ? Difficulty of Paying Living Expenses: Not hard at all  ?Food Insecurity: No Food Insecurity  ? Worried About Charity fundraiser in the Last Year: Never true  ? Ran Out of Food in the Last Year: Never true  ?Transportation Needs: No Transportation Needs  ? Lack of Transportation (Medical): No  ? Lack of Transportation (Non-Medical): No  ?Physical Activity: Sufficiently Active  ? Days of Exercise per Week: 5 days  ? Minutes of Exercise per Session: 30 min  ?Stress: No Stress Concern Present  ? Feeling of Stress : Not at all  ?Social Connections: Moderately Isolated  ? Frequency of Communication with Friends and Family: More than  three times a week  ? Frequency of Social Gatherings with Friends and Family: More than three times a week  ? Attends Religious Services: More than 4 times per year  ? Active Member of Clubs or Organizations: No  ? Attends Archivist Meetings: Never  ? Marital Status: Divorced  ?Intimate Partner Violence: Not At Risk  ? Fear of Current or Ex-Partner: No  ? Emotionally Abused: No  ? Physically Abused: No  ? Sexually Abused: No  ? ? ?Review of Systems: ?See HPI, otherwise negative ROS ? ?Physical Exam: ?BP (!) 134/58   Pulse 61   Temp 97.9 ?F (36.6 ?C)   Ht '5\' 3"'$  (1.6 m)   Wt 74.8 kg   LMP 02/07/1991   SpO2 100%   BMI 29.23 kg/m?  ?General:   Alert,  pleasant and cooperative in NAD ?Head:  Normocephalic and atraumatic. ?Neck:  Supple; no masses or thyromegaly. ?Lungs:  Clear throughout to auscultation.    ?Heart:  Regular rate and rhythm. ?Abdomen:  Soft, nontender and nondistended. Normal bowel sounds, without guarding, and without rebound.   ?Neurologic:  Alert and  oriented x4;  grossly normal neurologically. ? ?Impression/Plan: ?Wendy Tucker is here for an colonoscopy to be performed for a history of adenomatous polyps on 2018 ? ? ?Risks, benefits, limitations, and alternatives regarding  colonoscopy have been reviewed with the patient.  Questions have been answered.  All parties agreeable. ? ? ?Lucilla Lame, MD  11/15/2021, 7:31 AM ?

## 2021-11-15 NOTE — Op Note (Signed)
[facility] ?Gastroenterology ?Patient Name: Wendy Tucker ?Procedure Date: 11/15/2021 7:15 AM ?MRN: [id number] ?Account #: [id number] ?Date of Birth: September 06, 1950 ?Admit Type: Outpatient ?Age: 71 ?Room: [facility] OR ROOM 01 ?Gender: Female ?Note Status: Finalized ?Instrument Name: [id number] ?Procedure:             Colonoscopy ?Indications:           High risk colon cancer surveillance: Personal history  ?                       of colonic polyps ?Providers:             Lucilla Lame MD, MD ?Referring MD:          Juline Patch, MD (Referring MD) ?Medicines:             Propofol per Anesthesia ?Complications:         No immediate complications. ?Procedure:             Pre-Anesthesia Assessment: ?                       - Prior to the procedure, a History and Physical was  ?                       performed, and patient medications and allergies were  ?                       reviewed. The patient's tolerance of previous  ?                       anesthesia was also reviewed. The risks and benefits  ?                       of the procedure and the sedation options and risks  ?                       were discussed with the patient. All questions were  ?                       answered, and informed consent was obtained. Prior  ?                       Anticoagulants: The patient has taken no previous  ?                       anticoagulant or antiplatelet agents. ASA Grade  ?                       Assessment: II - A patient with mild systemic disease.  ?                       After reviewing the risks and benefits, the patient  ?                       was deemed in satisfactory condition to undergo the  ?                       procedure. ?  After obtaining informed consent, the colonoscope was  ?                       passed under direct vision. Throughout the procedure,  ?                       the patient's blood pressure, pulse, and oxygen  ?                       saturations were monitored  continuously. The  ?                       Colonoscope was introduced through the anus and  ?                       advanced to the the cecum, identified by appendiceal  ?                       orifice and ileocecal valve. The colonoscopy was  ?                       performed without difficulty. The patient tolerated  ?                       the procedure well. The quality of the bowel  ?                       preparation was excellent. ?Findings: ?     The perianal and digital rectal examinations were normal. ?     A 4 mm polyp was found in the cecum. The polyp was sessile. The polyp  ?     was removed with a cold snare. Resection and retrieval were complete. ?     A 2 mm polyp was found in the descending colon. The polyp was sessile.  ?     The polyp was removed with a cold snare. Resection and retrieval were  ?     complete. ?     Multiple small-mouthed diverticula were found in the sigmoid colon. ?     Non-bleeding internal hemorrhoids were found during retroflexion. The  ?     hemorrhoids were Grade II (internal hemorrhoids that prolapse but reduce  ?     spontaneously). ?Impression:            - One 4 mm polyp in the cecum, removed with a cold  ?                       snare. Resected and retrieved. ?                       - One 2 mm polyp in the descending colon, removed with  ?                       a cold snare. Resected and retrieved. ?                       - Diverticulosis in the sigmoid colon. ?                       - Non-bleeding internal hemorrhoids. ?Recommendation:        -  Discharge patient to home. ?                       - Resume previous diet. ?                       - Continue present medications. ?                       - Await pathology results. ?                       - Repeat colonoscopy in 5 years for surveillance. ?Procedure Code(s):     --- Professional --- ?                       [phone], Colonoscopy, flexible; with removal of  ?                       tumor(s), polyp(s), or other  lesion(s) by snare  ?                       technique ?Diagnosis Code(s):     --- Professional --- ?                       Z86.010, Personal history of colonic polyps ?                       K63.5, Polyp of colon ?CPT copyright 2019 American Medical Association. All rights reserved. ?The codes documented in this report are preliminary and upon coder review may  ?be revised to meet current compliance requirements. ?Lucilla Lame MD, MD ?11/15/2021 8:09:35 AM ?This report has been signed electronically. ?Number of Addenda: 0 ?Note Initiated On: 11/15/2021 7:15 AM ?Scope Withdrawal Time: 0 hours 10 minutes 12 seconds  ?Total Procedure Duration: 0 hours 12 minutes 52 seconds  ?Estimated Blood Loss:  Estimated blood loss: none. ?     [facility] ?

## 2021-11-15 NOTE — Transfer of Care (Signed)
Immediate Anesthesia Transfer of Care Note ? ?Patient: Wendy Tucker ? ?Procedure(s) Performed: COLONOSCOPY WITH BIOPSY (Rectum) ?POLYPECTOMY (Rectum) ? ?Patient Location: PACU ? ?Anesthesia Type: General ? ?Level of Consciousness: awake, alert  and patient cooperative ? ?Airway and Oxygen Therapy: Patient Spontanous Breathing and Patient connected to supplemental oxygen ? ?Post-op Assessment: Post-op Vital signs reviewed, Patient's Cardiovascular Status Stable, Respiratory Function Stable, Patent Airway and No signs of Nausea or vomiting ? ?Post-op Vital Signs: Reviewed and stable ? ?Complications: No notable events documented. ? ?

## 2021-11-15 NOTE — Anesthesia Postprocedure Evaluation (Signed)
Anesthesia Post Note ? ?Patient: Wendy Tucker ? ?Procedure(s) Performed: COLONOSCOPY WITH BIOPSY (Rectum) ?POLYPECTOMY (Rectum) ? ? ?  ?Patient location during evaluation: PACU ?Anesthesia Type: General ?Level of consciousness: awake and alert ?Pain management: pain level controlled ?Vital Signs Assessment: post-procedure vital signs reviewed and stable ?Respiratory status: spontaneous breathing, nonlabored ventilation, respiratory function stable and patient connected to nasal cannula oxygen ?Cardiovascular status: blood pressure returned to baseline and stable ?Postop Assessment: no apparent nausea or vomiting ?Anesthetic complications: no ? ? ?No notable events documented. ? ?Raylynn Hersh ELAINE ? ? ? ? ? ?

## 2021-11-15 NOTE — Anesthesia Preprocedure Evaluation (Signed)
Anesthesia Evaluation  ?Patient identified by MRN, date of birth, ID band ?Patient awake ? ? ? ?Reviewed: ?Allergy & Precautions, H&P , NPO status  ? ?Airway ?Mallampati: I ? ?TM Distance: >3 FB ? ? ? ? Dental ? ?(+) Teeth Intact ?  ?Pulmonary ?neg pulmonary ROS, neg recent URI,  ?  ?breath sounds clear to auscultation ? ? ? ? ? ? Cardiovascular ?negative cardio ROS ? ? ?Rhythm:regular Rate:Normal ? ? ?  ?Neuro/Psych ?  ? GI/Hepatic ?negative GI ROS,   ?Endo/Other  ?diabetes, Type 2vitamin D deficiency ?Osteopenia ?BMI 30 ? Renal/GU ?  ? ?  ?Musculoskeletal ? ? Abdominal ?  ?Peds ? Hematology ?negative hematology ROS ?(+)   ?Anesthesia Other Findings ? ? Reproductive/Obstetrics ? ?  ? ? ? ? ? ? ? ? ? ? ? ? ? ?  ?  ? ? ? ? ? ? ? ? ?Anesthesia Physical ? ?Anesthesia Plan ? ?ASA: 2 ? ?Anesthesia Plan: General  ? ?Post-op Pain Management:   ? ?Induction:  ? ?PONV Risk Score and Plan: 3 and Propofol infusion and Treatment may vary due to age or medical condition ? ?Airway Management Planned:  ? ?Additional Equipment:  ? ?Intra-op Plan:  ? ?Post-operative Plan:  ? ?Informed Consent: I have reviewed the patients History and Physical, chart, labs and discussed the procedure including the risks, benefits and alternatives for the proposed anesthesia with the patient or authorized representative who has indicated his/her understanding and acceptance.  ? ? ? ?Dental Advisory Given ? ?Plan Discussed with: CRNA ? ?Anesthesia Plan Comments:   ? ? ? ? ? ? ?Anesthesia Quick Evaluation ? ?

## 2021-11-15 NOTE — Anesthesia Procedure Notes (Signed)
Date/Time: 11/15/2021 7:51 AM ?Performed by: Cameron Ali, CRNA ?Pre-anesthesia Checklist: Patient identified, Emergency Drugs available, Suction available, Timeout performed and Patient being monitored ?Patient Re-evaluated:Patient Re-evaluated prior to induction ?Oxygen Delivery Method: Nasal cannula ?Placement Confirmation: positive ETCO2 ? ? ? ? ?

## 2021-11-16 ENCOUNTER — Encounter: Payer: Self-pay | Admitting: Gastroenterology

## 2021-11-19 LAB — SURGICAL PATHOLOGY

## 2021-11-21 ENCOUNTER — Encounter: Payer: Self-pay | Admitting: Gastroenterology

## 2021-11-26 ENCOUNTER — Ambulatory Visit (INDEPENDENT_AMBULATORY_CARE_PROVIDER_SITE_OTHER): Payer: No Typology Code available for payment source

## 2021-11-26 DIAGNOSIS — Z0000 Encounter for general adult medical examination without abnormal findings: Secondary | ICD-10-CM | POA: Diagnosis not present

## 2021-11-26 NOTE — Progress Notes (Signed)
Subjective:   Wendy Tucker is a 71 y.o. female who presents for Medicare Annual (Subsequent) preventive examination.  Virtual Visit via Telephone Note  I connected with  Wendy Tucker on 11/26/21 at  1:45 PM EDT by telephone and verified that I am speaking with the correct person using two identifiers.  Location: Patient: home Provider: [facility] Persons participating in the virtual visit: Wendy Tucker   I discussed the limitations, risks, security and privacy concerns of performing an evaluation and management service by telephone and the availability of in person appointments. The patient expressed understanding and agreed to proceed.  Interactive audio and video telecommunications were attempted between this nurse and patient, however failed, due to patient having technical difficulties OR patient did not have access to video capability.  We continued and completed visit with audio only.  Some vital signs may be absent or patient reported.   Clemetine Marker, LPN   Review of Systems     Cardiac Risk Factors include: advanced age (>39mn, >>20women);diabetes mellitus;dyslipidemia     Objective:    There were no vitals filed for this visit. There is no height or weight on file to calculate BMI.     11/26/2021    1:51 PM 11/15/2021    7:08 AM 11/22/2020    2:26 PM 11/17/2019    3:29 PM 01/29/2017    9:36 AM 01/02/2017    8:47 AM 11/28/2016    8:35 AM  Advanced Directives  Does Patient Have a Medical Advance Directive? Yes Yes Yes Yes Yes Yes Yes  Type of AParamedicof AOrinLiving will HFranklinLiving will [place]villeLiving will [place]Living will   [place] Does patient want to make changes to medical advance directive?  No - Patient declined       Copy of [place]in Chart? No - copy requested Yes - validated most recent copy scanned in  chart (See row information) No - copy requested No - copy requested   Yes    Current Medications (verified) Outpatient Encounter Medications as of 11/26/2021  Medication Sig   Accu-Chek Softclix Lancets lancets Check BS 1 time daily   aspirin 81 MG tablet Take 1 tablet (81 mg total) by mouth daily.   cetirizine (ZYRTEC) 10 MG tablet Take 10 mg by mouth daily.   cholecalciferol (VITAMIN D) 1000 units tablet Take 1,000 Units by mouth daily.   glucose blood (ACCU-CHEK GUIDE) test strip TEST ONCE DAILY   metFORMIN (GLUCOPHAGE) 500 MG tablet TAKE 1 TABLET(500 MG) BY MOUTH TWICE DAILY WITH A MEAL   Multiple Minerals-Vitamins (NUTRA-SUPPORT BONE) CAPS Take 1 capsule by mouth daily.   simvastatin (ZOCOR) 10 MG tablet Take 1 tablet (10 mg total) by mouth at bedtime.   vitamin B-12 (CYANOCOBALAMIN) 500 MCG tablet Take 1 tablet (500 mcg total) by mouth daily.   [DISCONTINUED] triamcinolone cream (KENALOG) 0.1 % Apply 1 application topically 2 (two) times daily.   No facility-administered encounter medications on file as of 11/26/2021.    Allergies (verified) Cephalexin and Amoxicillin   History: Past Medical History:  Diagnosis Date   Diabetes mellitus without complication ([place]    Eye infection, right    Hyperlipidemia    Vertigo    x1, over 20 yrs ago   Past Surgical History:  Procedure Laterality Date   BREAST BIOPSY Bilateral    neg- needle bx/clips   BREAST BIOPSY  Right 03/18/2019   affirm bx of calcs medial, coil clip, negative   COLONOSCOPY WITH PROPOFOL N/A 11/28/2016   Procedure: COLONOSCOPY WITH PROPOFOL;  Surgeon: Lucilla Lame, MD;  Location: [place];  Service: Endoscopy;  Laterality: N/A;   COLONOSCOPY WITH PROPOFOL N/A 11/15/2021   Procedure: COLONOSCOPY WITH BIOPSY;  Surgeon: Lucilla Lame, MD;  Location: [place];  Service: Endoscopy;  Laterality: N/A;   ECTOPIC PREGNANCY SURGERY     fractured leg Right 2008   has metal rods   POLYPECTOMY  11/28/2016    Procedure: POLYPECTOMY;  Surgeon: Lucilla Lame, MD;  Location: [place];  Service: Endoscopy;;   POLYPECTOMY N/A 11/15/2021   Procedure: POLYPECTOMY;  Surgeon: Lucilla Lame, MD;  Location: [place];  Service: Endoscopy;  Laterality: N/A;   Family History  Problem Relation Age of Onset   Hypertension Mother    Stomach cancer Father    Breast cancer Sister 39   Bone cancer Sister    Social History   Socioeconomic History   Marital status: Single    Spouse name: Not on file   Number of children: 0   Years of education: Not on file   Highest education level: Not on file  Occupational History   Not on file  Tobacco Use   Smoking status: Never   Smokeless tobacco: Never  Vaping Use   Vaping Use: Never used  Substance and Sexual Activity   Alcohol use: Yes    Comment: Occasionally, 2 glass wine/month   Drug use: No   Sexual activity: Yes    Birth control/protection: Post-menopausal  Other Topics Concern   Not on file  Social History Narrative   Pt lives alone   Social Determinants of Health   Financial Resource Strain: Low Risk    Difficulty of Paying Living Expenses: Not hard at all  Food Insecurity: No Food Insecurity   Worried About Charity fundraiser in the Last Year: Never true   [place] in the Last Year: Never true  Transportation Needs: No Transportation Needs   Lack of Transportation (Medical): No   Lack of Transportation (Non-Medical): No  Physical Activity: Sufficiently Active   Days of Exercise per Week: 5 days   Minutes of Exercise per Session: 30 min  Stress: No Stress Concern Present   Feeling of Stress : Not at all  Social Connections: Moderately Isolated   Frequency of Communication with Friends and Family: More than three times a week   Frequency of Social Gatherings with Friends and Family: More than three times a week   Attends Religious Services: More than 4 times per year   Active Member of [employer] or Organizations:  No   Attends Archivist Meetings: Never   Marital Status: Divorced    Tobacco Counseling Counseling given: Not Answered   Clinical Intake:                Nutrition Risk Assessment:  Has the patient had any N/V/D within the last 2 months?  No  Does the patient have any non-healing wounds?  No  Has the patient had any unintentional weight loss or weight gain?  No   Diabetes:  Is the patient diabetic?  Yes  If diabetic, was a CBG obtained today?  No  Did the patient bring in their glucometer from home?  No  How often do you monitor your CBG's? Twice weekly.   Financial Strains and Diabetes Management:  Are you  having any financial strains with the device, your supplies or your medication? No .  Does the patient want to be seen by Chronic Care Management for management of their diabetes?  No  Would the patient like to be referred to a Nutritionist or for Diabetic Management?  No   Diabetic Exams:  Diabetic Eye Exam: Completed 02/15/21.   Diabetic Foot Exam: Completed 09/11/20. Pt has been advised about the importance in completing this exam. Pt is scheduled for diabetic foot exam on 12/20/21.           Activities of Daily Living    11/26/2021    1:52 PM 11/15/2021    7:00 AM  In your present state of health, do you have any difficulty performing the following activities:  Hearing? 0 0  Vision? 0 0  Difficulty concentrating or making decisions? 0 0  Walking or climbing stairs? 0 0  Dressing or bathing? 0 0  Doing errands, shopping? 0   Preparing Food and eating ? N   Using the Toilet? N   In the past six months, have you accidently leaked urine? N   Do you have problems with loss of bowel control? N   Managing your Medications? N   Managing your Finances? N   Housekeeping or managing your Housekeeping? N     Patient Care Team: Juline Patch, MD as PCP - General (Family Medicine) Rubie Maid, MD as Referring Physician (Obstetrics and  Gynecology)  Indicate any recent Medical Services you may have received from other than [facility] providers in the past year (date may be approximate).     Assessment:   This is a routine wellness examination for Wendy Tucker.  Hearing/Vision screen Hearing Screening - Comments:: Pt denies hearing difficulty Vision Screening - Comments:: Annual vision screenings done [facility] Dr[facility] Wallace [facility]  Dietary issues and exercise activities discussed: Current Exercise Habits: Home exercise routine, Type of exercise: treadmill, Time (Minutes): 30, Frequency (Times/Week): 5, Weekly Exercise (Minutes/Week): 150, Intensity: Moderate, Exercise limited by: None identified   Goals Addressed             This Visit's Progress    Patient Stated   On track    Patient states she would like to maintain health and current A1c of less than 6.5%       Depression Screen    11/26/2021    1:50 PM 01/30/2021    8:34 AM 11/22/2020    2:25 PM 09/11/2020    3:23 PM 11/18/2019    8:09 AM 11/17/2019    3:28 PM 05/07/2018    7:58 AM  PHQ 2/9 Scores  PHQ - 2 Score 0 0 0 0 0 0 0  PHQ- 9 Score  0  0 0  0    Fall Risk    11/26/2021    1:52 PM 11/22/2020    2:27 PM 09/11/2020    3:23 PM 11/18/2019    8:09 AM 11/17/2019    3:30 PM  [place] in the past year? 0 0 0 0 0  Number falls in past yr: 0 0   0  Injury with Fall? 0 0   0  Risk for fall due to : No Fall Risks No Fall Risks   No Fall Risks  Follow up Falls prevention discussed Falls prevention discussed Falls evaluation completed Falls evaluation completed Falls prevention discussed    FALL RISK PREVENTION PERTAINING TO THE HOME:  Any stairs in or  around the home? Yes  If so, are there any without handrails? Yes  - 1 step outside Home free of loose throw rugs in walkways, pet beds, electrical cords, etc? Yes  Adequate lighting in your home to reduce risk of falls? Yes   ASSISTIVE DEVICES UTILIZED TO PREVENT FALLS:  Life alert? No  Use  of a cane, walker or w/c? No  Grab bars in the bathroom? Yes  Shower chair or bench in shower? No  Elevated toilet seat or a handicapped toilet? Yes   TIMED UP AND GO:  Was the test performed? No . Telephonic visit  Cognitive Function: Normal cognitive status assessed by direct observation by this Nurse Health Advisor. No abnormalities found.          11/17/2019    3:32 PM  6CIT Screen  What Year? 0 points  What month? 0 points  What time? 0 points  Count back from 20 0 points  Months in reverse 0 points  Repeat phrase 0 points  Total Score 0 points    Immunizations Immunization History  Administered Date(s) Administered   Fluad Quad(high Dose 65+) 03/25/2019, 03/30/2020   Influenza-Unspecified 04/08/2021   Moderna Covid-19 Vaccine Bivalent Booster 63yr & up 05/07/2021   Moderna Sars-Covid-2 Vaccination 07/11/2019, 08/08/2019, 05/04/2020, 10/11/2020   Pneumococcal Conjugate-13 10/24/2016   Pneumococcal Polysaccharide-23 05/27/2019   Tdap 10/24/2016   Zoster Recombinat (Shingrix) 09/12/2020, 03/14/2021    TDAP status: Up to date  Flu Vaccine status: Up to date  Pneumococcal vaccine status: Up to date  Covid-19 vaccine status: Completed vaccines  Qualifies for Shingles Vaccine? Yes   Zostavax completed No   Shingrix Completed?: Yes  Screening Tests Health Maintenance  Topic Date Due   Hepatitis C Screening  Never done   FOOT EXAM  09/11/2021   HEMOGLOBIN A1C  12/14/2021   INFLUENZA VACCINE  02/05/2022   OPHTHALMOLOGY EXAM  02/15/2022   MAMMOGRAM  05/03/2022   URINE MICROALBUMIN  06/15/2022   TETANUS/TDAP  10/25/2026   COLONOSCOPY (Pts 45-442yrInsurance coverage will need to be confirmed)  11/16/2026   Pneumonia Vaccine 6535Years old  Completed   DEXA SCAN  Completed   COVID-19 Vaccine  Completed   Zoster Vaccines- Shingrix  Completed   HPV VACCINES  Aged Out    Health Maintenance  Health Maintenance Due  Topic Date Due   Hepatitis C Screening   Never done   FOOT EXAM  09/11/2021    Colorectal cancer screening: Type of screening: Colonoscopy. Completed 11/15/21. Repeat every 5 years  Mammogram status: Completed 05/03/21. Repeat every year  Bone Density status: Completed 01/18/21. Results reflect: Bone density results: OSTEOPENIA. Repeat every 2 years.  Lung Cancer Screening: (Low Dose CT Chest recommended if Age 71-80ears, 30 pack-year currently smoking OR have quit w/in 15years.) does not qualify.   Additional Screening:  Hepatitis C Screening: does qualify; due  Vision Screening: Recommended annual ophthalmology exams for early detection of glaucoma and other disorders of the eye. Is the patient up to date with their annual eye exam?  Yes  Who is the provider or what is the name of the office in which the patient attends annual eye exams? Dr. BrWallace Going  Dental Screening: Recommended annual dental exams for proper oral hygiene  Community Resource Referral / Chronic Care Management: CRR required this visit?  No   CCM required this visit?  No      Plan:     I have personally reviewed and noted  the following in the patient's chart:   Medical and social history Use of alcohol, tobacco or illicit drugs  Current medications and supplements including opioid prescriptions.  Functional ability and status Nutritional status Physical activity Advanced directives List of other physicians Hospitalizations, surgeries, and ER visits in previous 12 months Vitals Screenings to include cognitive, depression, and falls Referrals and appointments  In addition, I have reviewed and discussed with patient certain preventive protocols, quality metrics, and best practice recommendations. A written personalized care plan for preventive services as well as general preventive health recommendations were provided to patient.     Clemetine Marker, LPN   [id number]   Nurse Notes: none

## 2021-11-26 NOTE — Patient Instructions (Signed)
Wendy Tucker , Thank you for taking time to come for your Medicare Wellness Visit. I appreciate your ongoing commitment to your health goals. Please review the following plan we discussed and let me know if I can assist you in the future.   Screening recommendations/referrals: Colonoscopy: done 11/15/21. Repeat 11/2026 Mammogram: done 05/03/21 Bone Density: done 01/18/21 Recommended yearly ophthalmology/optometry visit for glaucoma screening and checkup Recommended yearly dental visit for hygiene and checkup  Vaccinations: Influenza vaccine: done 04/08/21 Pneumococcal vaccine: done 05/27/19 Tdap vaccine: done 10/24/16 Shingles vaccine: done 09/12/20 & 03/14/21   Covid-19:done 07/11/19, 08/08/19, 05/04/20, 10/11/20 & 05/07/21  Advanced directives: Please bring a copy of your health care power of attorney and living will to the office at your convenience.   Conditions/risks identified: Keep up the great work!  Next appointment: Follow up in one year for your annual wellness visit    Preventive Care 65 Years and Older, Female Preventive care refers to lifestyle choices and visits with your health care provider that can promote health and wellness. What does preventive care include? A yearly physical exam. This is also called an annual well check. Dental exams once or twice a year. Routine eye exams. Ask your health care provider how often you should have your eyes checked. Personal lifestyle choices, including: Daily care of your teeth and gums. Regular physical activity. Eating a healthy diet. Avoiding tobacco and drug use. Limiting alcohol use. Practicing safe sex. Taking low-dose aspirin every day. Taking vitamin and mineral supplements as recommended by your health care provider. What happens during an annual well check? The services and screenings done by your health care provider during your annual well check will depend on your age, overall health, lifestyle risk factors, and family  history of disease. Counseling  Your health care provider may ask you questions about your: Alcohol use. Tobacco use. Drug use. Emotional well-being. Home and relationship well-being. Sexual activity. Eating habits. History of falls. Memory and ability to understand (cognition). Work and work Statistician. Reproductive health. Screening  You may have the following tests or measurements: Height, weight, and BMI. Blood pressure. Lipid and cholesterol levels. These may be checked every 5 years, or more frequently if you are over 75 years old. Skin check. Lung cancer screening. You may have this screening every year starting at age 39 if you have a 30-pack-year history of smoking and currently smoke or have quit within the past 15 years. Fecal occult blood test (FOBT) of the stool. You may have this test every year starting at age 61. Flexible sigmoidoscopy or colonoscopy. You may have a sigmoidoscopy every 5 years or a colonoscopy every 10 years starting at age 90. Hepatitis C blood test. Hepatitis B blood test. Sexually transmitted disease (STD) testing. Diabetes screening. This is done by checking your blood sugar (glucose) after you have not eaten for a while (fasting). You may have this done every 1-3 years. Bone density scan. This is done to screen for osteoporosis. You may have this done starting at age 81. Mammogram. This may be done every 1-2 years. Talk to your health care provider about how often you should have regular mammograms. Talk with your health care provider about your test results, treatment options, and if necessary, the need for more tests. Vaccines  Your health care provider may recommend certain vaccines, such as: Influenza vaccine. This is recommended every year. Tetanus, diphtheria, and acellular pertussis (Tdap, Td) vaccine. You may need a Td booster every 10 years. Zoster vaccine. You  may need this after age 77. Pneumococcal 13-valent conjugate (PCV13)  vaccine. One dose is recommended after age 26. Pneumococcal polysaccharide (PPSV23) vaccine. One dose is recommended after age 27. Talk to your health care provider about which screenings and vaccines you need and how often you need them. This information is not intended to replace advice given to you by your health care provider. Make sure you discuss any questions you have with your health care provider. Document Released: 07/21/2015 Document Revised: 03/13/2016 Document Reviewed: 04/25/2015 Elsevier Interactive Patient Education  2017 [place] Prevention in the Home Falls can cause injuries. They can happen to people of all ages. There are many things you can do to make your home safe and to help prevent falls. What can I do on the outside of my home? Regularly fix the edges of walkways and driveways and fix any cracks. Remove anything that might make you trip as you walk through a door, such as a raised step or threshold. Trim any bushes or trees on the path to your home. Use bright outdoor lighting. Clear any walking paths of anything that might make someone trip, such as rocks or tools. Regularly check to see if handrails are loose or broken. Make sure that both sides of any steps have handrails. Any raised decks and porches should have guardrails on the edges. Have any leaves, snow, or ice cleared regularly. Use sand or salt on walking paths during winter. Clean up any spills in your garage right away. This includes oil or grease spills. What can I do in the bathroom? Use night lights. Install grab bars by the toilet and in the tub and shower. Do not use towel bars as grab bars. Use non-skid mats or decals in the tub or shower. If you need to sit down in the shower, use a plastic, non-slip stool. Keep the floor dry. Clean up any water that spills on the floor as soon as it happens. Remove soap buildup in the tub or shower regularly. Attach bath mats securely with  double-sided non-slip rug tape. Do not have throw rugs and other things on the floor that can make you trip. What can I do in the bedroom? Use night lights. Make sure that you have a light by your bed that is easy to reach. Do not use any sheets or blankets that are too big for your bed. They should not hang down onto the floor. Have a firm chair that has side arms. You can use this for support while you get dressed. Do not have throw rugs and other things on the floor that can make you trip. What can I do in the kitchen? Clean up any spills right away. Avoid walking on wet floors. Keep items that you use a lot in easy-to-reach places. If you need to reach something above you, use a strong step stool that has a grab bar. Keep electrical cords out of the way. Do not use floor polish or wax that makes floors slippery. If you must use wax, use non-skid floor wax. Do not have throw rugs and other things on the floor that can make you trip. What can I do with my stairs? Do not leave any items on the stairs. Make sure that there are handrails on both sides of the stairs and use them. Fix handrails that are broken or loose. Make sure that handrails are as long as the stairways. Check any carpeting to make sure that it is firmly  attached to the stairs. Fix any carpet that is loose or worn. Avoid having throw rugs at the top or bottom of the stairs. If you do have throw rugs, attach them to the floor with carpet tape. Make sure that you have a light switch at the top of the stairs and the bottom of the stairs. If you do not have them, ask someone to add them for you. What else can I do to help prevent falls? Wear shoes that: Do not have high heels. Have rubber bottoms. Are comfortable and fit you well. Are closed at the toe. Do not wear sandals. If you use a stepladder: Make sure that it is fully opened. Do not climb a closed stepladder. Make sure that both sides of the stepladder are locked  into place. Ask someone to hold it for you, if possible. Clearly mark and make sure that you can see: Any grab bars or handrails. First and last steps. Where the edge of each step is. Use tools that help you move around (mobility aids) if they are needed. These include: Canes. Walkers. Scooters. Crutches. Turn on the lights when you go into a dark area. Replace any light bulbs as soon as they burn out. Set up your furniture so you have a clear path. Avoid moving your furniture around. If any of your floors are uneven, fix them. If there are any pets around you, be aware of where they are. Review your medicines with your doctor. Some medicines can make you feel dizzy. This can increase your chance of falling. Ask your doctor what other things that you can do to help prevent falls. This information is not intended to replace advice given to you by your health care provider. Make sure you discuss any questions you have with your health care provider. Document Released: 04/20/2009 Document Revised: 11/30/2015 Document Reviewed: 07/29/2014 Elsevier Interactive Patient Education  2017 [employer].

## 2021-12-20 ENCOUNTER — Ambulatory Visit (INDEPENDENT_AMBULATORY_CARE_PROVIDER_SITE_OTHER): Payer: No Typology Code available for payment source | Admitting: Family Medicine

## 2021-12-20 ENCOUNTER — Encounter: Payer: Self-pay | Admitting: Family Medicine

## 2021-12-20 VITALS — BP 118/68 | HR 72 | Ht 63.0 in | Wt 169.0 lb

## 2021-12-20 DIAGNOSIS — R7303 Prediabetes: Secondary | ICD-10-CM

## 2021-12-20 MED ORDER — METFORMIN HCL 500 MG PO TABS: ORAL_TABLET | ORAL | 1 refills | Status: DC

## 2021-12-20 NOTE — Progress Notes (Signed)
Date:  12/20/2021   Name:  Wendy Tucker   DOB:  [birth year]   MRN:  [id number]   Chief Complaint: Prediabetes (Foot exam)  Diabetes She presents for her follow-up diabetic visit. Diabetes type: prediabetes. Her disease course has been stable. There are no hypoglycemic associated symptoms. Pertinent negatives for hypoglycemia include no dizziness, headaches or nervousness/anxiousness. Pertinent negatives for diabetes include no blurred vision, no chest pain, no fatigue, no foot paresthesias, no foot ulcerations, no polydipsia, no polyphagia, no polyuria, no visual change, no weakness and no weight loss. There are no hypoglycemic complications. Symptoms are stable. There are no diabetic complications. Risk factors for coronary artery disease include dyslipidemia. Current diabetic treatment includes oral agent (monotherapy). She is compliant with treatment all of the time. Her weight is stable. She is following a generally healthy diet. Meal planning includes avoidance of concentrated sweets and carbohydrate counting.    Lab Results  Component Value Date   NA 141 06/15/2021   K 4.1 06/15/2021   CO2 25 06/15/2021   GLUCOSE 110 (H) 06/15/2021   BUN 14 06/15/2021   CREATININE 0.91 06/15/2021   CALCIUM 9.8 06/15/2021   EGFR 68 06/15/2021   GFRNONAA 59 (L) 05/25/2020   Lab Results  Component Value Date   CHOL 195 06/15/2021   HDL 79 06/15/2021   LDLCALC 104 (H) 06/15/2021   TRIG 65 06/15/2021   CHOLHDL 3.3 05/27/2019   Lab Results  Component Value Date   TSH 1.100 05/27/2019   Lab Results  Component Value Date   HGBA1C 6.0 (H) 06/15/2021   Lab Results  Component Value Date   WBC 8.4 02/07/2016   HGB 12.9 02/07/2016   HCT 39.0 02/07/2016   MCV 91 02/07/2016   PLT 206 02/07/2016   Lab Results  Component Value Date   ALT 11 06/15/2021   AST 24 06/15/2021   ALKPHOS 74 06/15/2021   BILITOT 0.6 06/15/2021   Lab Results  Component Value Date   VD25OH 31.9 10/24/2016      Review of Systems  Constitutional: Negative.  Negative for chills, fatigue, fever, unexpected weight change and weight loss.  HENT:  Negative for congestion, ear discharge, ear pain, rhinorrhea, sinus pressure, sneezing and sore throat.   Eyes:  Negative for blurred vision.  Respiratory:  Negative for cough, shortness of breath, wheezing and stridor.   Cardiovascular:  Negative for chest pain.  Gastrointestinal:  Negative for abdominal pain, blood in stool, constipation, diarrhea and nausea.  Endocrine: Negative for polydipsia, polyphagia and polyuria.  Genitourinary:  Negative for dysuria, flank pain, frequency, hematuria, urgency and vaginal discharge.  Musculoskeletal:  Negative for arthralgias, back pain and myalgias.  Skin:  Negative for rash.  Neurological:  Negative for dizziness, weakness and headaches.  Hematological:  Negative for adenopathy. Does not bruise/bleed easily.  Psychiatric/Behavioral:  Negative for dysphoric mood. The patient is not nervous/anxious.     Patient Active Problem List   Diagnosis Date Noted   Personal history of colonic polyps    Polyp of colon    Special screening for malignant neoplasms, colon    Polyp of sigmoid colon    Benign neoplasm of descending colon    Hyperlipidemia 11/21/2016   Vitamin D deficiency 10/24/2016   Osteopenia 10/24/2016   Diabetes mellitus type 2, controlled, without complications ([place]) 75/91/6384   B12 deficiency 10/24/2016   Obesity (BMI 30.0-34.9) 10/24/2016   FH: Alzheimer's disease 10/24/2016   FH: gastric cancer 10/24/2016   FH: breast  cancer 10/24/2016    Allergies  Allergen Reactions   Cephalexin    Amoxicillin Rash    Past Surgical History:  Procedure Laterality Date   BREAST BIOPSY Bilateral    neg- needle bx/clips   BREAST BIOPSY Right 03/18/2019   affirm bx of calcs medial, coil clip, negative   COLONOSCOPY WITH PROPOFOL N/A 11/28/2016   Procedure: COLONOSCOPY WITH PROPOFOL;  Surgeon: Lucilla Lame, MD;  Location: [facility];  Service: Endoscopy;  Laterality: N/A;   COLONOSCOPY WITH PROPOFOL N/A 11/15/2021   Procedure: COLONOSCOPY WITH BIOPSY;  Surgeon: Lucilla Lame, MD;  Location: [facility];  Service: Endoscopy;  Laterality: N/A;   ECTOPIC PREGNANCY SURGERY     fractured leg Right 2008   has metal rods   POLYPECTOMY  11/28/2016   Procedure: POLYPECTOMY;  Surgeon: Lucilla Lame, MD;  Location: [facility];  Service: Endoscopy;;   POLYPECTOMY N/A 11/15/2021   Procedure: POLYPECTOMY;  Surgeon: Lucilla Lame, MD;  Location: [facility];  Service: Endoscopy;  Laterality: N/A;    Social History   Tobacco Use   Smoking status: Never   Smokeless tobacco: Never  Vaping Use   Vaping Use: Never used  Substance Use Topics   Alcohol use: Yes    Comment: Occasionally, 2 glass wine/month   Drug use: No     Medication list has been reviewed and updated.  No outpatient medications have been marked as taking for the 12/20/21 encounter (Office Visit) with Juline Patch, MD.       12/20/2021    8:05 AM 01/30/2021    8:34 AM 09/11/2020    3:23 PM 11/18/2019    8:09 AM  GAD 7 : Generalized Anxiety Score  Nervous, Anxious, on Edge 0 0 0 0  Control/stop worrying 0 0 0 0  Worry too much - different things 0 0 0 0  Trouble relaxing 0 0 0 0  Restless 0 0 0 0  Easily annoyed or irritable 0 0 0 0  Afraid - awful might happen 0 0 0 0  Total GAD 7 Score 0 0 0 0  Anxiety Difficulty Not difficult at all          12/20/2021    8:04 AM  Depression screen PHQ 2/9  Decreased Interest 0  Down, Depressed, Hopeless 0  PHQ - 2 Score 0  Altered sleeping 1  Tired, decreased energy 0  Change in appetite 0  Feeling bad or failure about yourself  0  Trouble concentrating 0  Moving slowly or fidgety/restless 0  Suicidal thoughts 0  PHQ-9 Score 1  Difficult doing work/chores Not difficult at all    BP Readings from Last 3 Encounters:  12/20/21 118/68   11/15/21 (!) 108/56  06/15/21 110/62    Physical Exam Vitals and nursing note reviewed.  Constitutional:      Appearance: She is well-developed.  HENT:     Head: Normocephalic.     Right Ear: Tympanic membrane and external ear normal.     Left Ear: External ear normal. A middle ear effusion is present.  Eyes:     General: Lids are everted, no foreign bodies appreciated. No scleral icterus.       Left eye: No foreign body or hordeolum.     Conjunctiva/sclera: Conjunctivae normal.     Right eye: Right conjunctiva is not injected.     Left eye: Left conjunctiva is not injected.     Pupils: Pupils are equal, round, and  reactive to light.  Neck:     Thyroid: No thyromegaly.     Vascular: No JVD.     Trachea: No tracheal deviation.  Cardiovascular:     Rate and Rhythm: Normal rate and regular rhythm.     Heart sounds: Normal heart sounds. No murmur heard.    No friction rub. No gallop.  Pulmonary:     Effort: Pulmonary effort is normal. No respiratory distress.     Breath sounds: Normal breath sounds. No wheezing or rales.  Abdominal:     General: Bowel sounds are normal.     Palpations: Abdomen is soft. There is no mass.     Tenderness: There is no abdominal tenderness. There is no guarding or rebound.  Musculoskeletal:        General: No tenderness. Normal range of motion.     Cervical back: Normal range of motion and neck supple.  Lymphadenopathy:     Cervical: No cervical adenopathy.  Skin:    General: Skin is warm.     Findings: No rash.  Neurological:     Mental Status: She is alert and oriented to person, place, and time.     Cranial Nerves: No cranial nerve deficit.     Deep Tendon Reflexes: Reflexes normal.  Psychiatric:        Mood and Affect: Mood is not anxious or depressed.     Wt Readings from Last 3 Encounters:  12/20/21 169 lb (76.7 kg)  11/15/21 165 lb (74.8 kg)  06/15/21 171 lb (77.6 kg)    BP 118/68   Pulse 72   Ht _0  (1.6 m)   Wt 169 lb  (76.7 kg)   LMP 02/07/1991   BMI 29.94 kg/m   Assessment and Plan:  1. Prediabetes Chronic.  Controlled.  Stable.  A1c's in the low sixes.  Foot exam is normal today.  Repeat Pete A1c and continue metformin 500 mg twice a day.  We will recheck patient in 4 months. - metFORMIN (GLUCOPHAGE) 500 MG tablet; TAKE 1 TABLET(500 MG) BY MOUTH TWICE DAILY WITH A MEAL  Dispense: 180 tablet; Refill: 1 - Hemoglobin A1c

## 2021-12-21 LAB — HEMOGLOBIN A1C
Est. average glucose Bld gHb Est-mCnc: 131 mg/dL
Hgb A1c MFr Bld: 6.2 % — ABNORMAL HIGH (ref 4.8–5.6)

## 2022-01-28 ENCOUNTER — Telehealth: Payer: Self-pay | Admitting: Family Medicine

## 2022-01-28 NOTE — Telephone Encounter (Signed)
Left voicemail, Pt needs appt for med refill- this month

## 2022-01-28 NOTE — Telephone Encounter (Signed)
-----   Message from Fredderick Severance sent at 01/28/2022 10:02 AM EDT ----- Pt needs appt for med refill- this month

## 2022-01-28 NOTE — Telephone Encounter (Signed)
Requesting a call back, pt has questions

## 2022-01-28 NOTE — Telephone Encounter (Signed)
Pt called requesting to decline this because she says she just had an appt last month   Best contact: [phone]

## 2022-01-30 ENCOUNTER — Other Ambulatory Visit: Payer: Self-pay | Admitting: Family Medicine

## 2022-01-30 DIAGNOSIS — E7801 Familial hypercholesterolemia: Secondary | ICD-10-CM

## 2022-01-30 NOTE — Telephone Encounter (Signed)
Medication Refill - Medication: simvastatin (ZOCOR) 10 MG tablet  Has the patient contacted their pharmacy? No.   Preferred Pharmacy (with phone number or street name):  [id number] - [place], [address] AT [place] Phone:  [phone]  Fax:  [phone]     Has the patient been seen for an appointment in the last year OR does the patient have an upcoming appointment? Yes.

## 2022-01-31 MED ORDER — SIMVASTATIN 10 MG PO TABS: 10.0000 mg | ORAL_TABLET | Freq: Every day | ORAL | 1 refills | Status: DC

## 2022-01-31 NOTE — Telephone Encounter (Signed)
Requested Prescriptions  Pending Prescriptions Disp Refills  . simvastatin (ZOCOR) 10 MG tablet 90 tablet 1    Sig: Take 1 tablet (10 mg total) by mouth at bedtime.     Cardiovascular:  Antilipid - Statins Failed - 01/30/2022 11:26 AM      Failed - Lipid Panel in normal range within the last 12 months    Cholesterol, Total  Date Value Ref Range Status  06/15/2021 195 100 - 199 mg/dL Final   LDL Chol Calc (NIH)  Date Value Ref Range Status  06/15/2021 104 (H) 0 - 99 mg/dL Final   HDL  Date Value Ref Range Status  06/15/2021 79 >39 mg/dL Final   Triglycerides  Date Value Ref Range Status  06/15/2021 65 0 - 149 mg/dL Final         Passed - Patient is not pregnant      Passed - Valid encounter within last 12 months    Recent Outpatient Visits          1 month ago Prediabetes   [facility] Juline Patch, MD   7 months ago Prediabetes   [facility] Juline Patch, MD   1 year ago Urticaria   [facility] Juline Patch, MD   1 year ago Prediabetes   [facility] Juline Patch, MD   1 year ago Prediabetes   [facility] Juline Patch, MD      Future Appointments            In 4 months Juline Patch, MD [facility], [facility]

## 2022-02-12 ENCOUNTER — Ambulatory Visit (INDEPENDENT_AMBULATORY_CARE_PROVIDER_SITE_OTHER): Payer: No Typology Code available for payment source | Admitting: Family Medicine

## 2022-02-12 ENCOUNTER — Encounter: Payer: Self-pay | Admitting: Family Medicine

## 2022-02-12 VITALS — BP 110/62 | HR 80 | Ht 68.0 in | Wt 168.0 lb

## 2022-02-12 DIAGNOSIS — L509 Urticaria, unspecified: Secondary | ICD-10-CM

## 2022-02-12 MED ORDER — PREDNISONE 10 MG PO TABS: ORAL_TABLET | ORAL | 1 refills | Status: DC

## 2022-02-12 MED ORDER — TRIAMCINOLONE ACETONIDE 0.1 % EX CREA: 1.0000 | TOPICAL_CREAM | Freq: Two times a day (BID) | CUTANEOUS | 0 refills | Status: DC

## 2022-02-12 NOTE — Patient Instructions (Signed)
Hives Hives (urticaria) are itchy, red, swollen areas on the skin. Hives can appear on any part of the body. Hives often fade within 24 hours (acute hives). Sometimes, new hives appear after old ones fade and the cycle can continue for several days or weeks (chronic hives). Hives do not spread from person to person (are not contagious). Hives come from the body's reaction to something a person is allergic to (allergen), something that causes irritation, or various other triggers. When a person is exposed to a trigger, his or her body releases a chemical (histamine) that causes redness, itching, and swelling. Hives can appear right after exposure to a trigger or hours later. What are the causes? This condition may be caused by: Allergies to foods or ingredients. Insect bites or stings. Exposure to pollen or pets. Spending time in sunlight, heat, or cold (exposure). Exercise. Stress. You can also get hives from other medical conditions and treatments, such as: Viruses, including the common cold. Bacterial infections, such as urinary tract infections and strep throat. Certain medicines. Contact with latex or chemicals. Allergy shots. Blood transfusions. Sometimes, the cause of this condition is not known (idiopathic hives). What increases the risk? You are more likely to develop this condition if you: Are a woman. Have food allergies, especially to citrus fruits, milk, eggs, peanuts, tree nuts, or shellfish. Are allergic to: Medicines. Latex. Insects. Animals. Pollen. What are the signs or symptoms? Common symptoms of this condition include raised, itchy, red or white bumps or patches on your skin. These areas may: Become large and swollen (welts). Change in shape and location, quickly and repeatedly. Be separate hives or connect over a large area of skin. Sting or become painful. Turn white when pressed in the center (blanch). In severe cases, your hands, feet, and face may also  become swollen. This may occur if hives develop deeper in your skin. How is this diagnosed? This condition may be diagnosed by your symptoms, medical history, and physical exam. Your skin, urine, or blood may be tested to find out what is causing your hives and to rule out other health issues. Your health care provider may also remove a small sample of skin from the affected area and examine it under a microscope (biopsy). How is this treated? Treatment for this condition depends on the cause and severity of your symptoms. Your health care provider may recommend using cool, wet cloths (cool compresses) or taking cool showers to relieve itching. Treatment may include: Medicines that help: Relieve itching (antihistamines). Reduce swelling (corticosteroids). Treat infection (antibiotics). An injectable medicine (omalizumab). Your health care provider may prescribe this if you have chronic idiopathic hives and you continue to have symptoms even after treatment with antihistamines. Severe cases may require an emergency injection of adrenaline (epinephrine) to prevent a life-threatening allergic reaction (anaphylaxis). Follow these instructions at home: Medicines Take and apply over-the-counter and prescription medicines only as told by your health care provider. If you were prescribed an antibiotic medicine, take it as told by your health care provider. Do not stop using the antibiotic even if you start to feel better. Skin care Apply cool compresses to the affected areas. Do not scratch or rub your skin. General instructions Do not take hot showers or baths. This can make itching worse. Do not wear tight-fitting clothing. Use sunscreen and wear protective clothing when you are outside. Avoid any substances that cause your hives. Keep a journal to help track what causes your hives. Write down: What medicines you take.   What you eat and drink. What products you use on your skin. Keep all  follow-up visits as told by your health care provider. This is important. Contact a health care provider if: Your symptoms are not controlled with medicine. Your joints are painful or swollen. Get help right away if: You have a fever. You have pain in your abdomen. Your tongue or lips are swollen. Your eyelids are swollen. Your chest or throat feels tight. You have trouble breathing or swallowing. These symptoms may represent a serious problem that is an emergency. Do not wait to see if the symptoms will go away. Get medical help right away. Call your local emergency services (911 in the [place] Do not drive yourself to the hospital. Summary Hives (urticaria) are itchy, red, swollen areas on your skin. Hives come from the body's reaction to something a person is allergic to (allergen), something that causes irritation, or various other triggers. Treatment for this condition depends on the cause and severity of your symptoms. Avoid any substances that cause your hives. Keep a journal to help track what causes your hives. Take and apply over-the-counter and prescription medicines only as told by your health care provider. Get help right away if your chest or throat feels tight or if you have trouble breathing or swallowing. This information is not intended to replace advice given to you by your health care provider. Make sure you discuss any questions you have with your health care provider. Document Revised: 11/07/2021 Document Reviewed: 08/13/2020 Elsevier Patient Education  2023 [employer] Inc[employer]  

## 2022-02-12 NOTE — Progress Notes (Unsigned)
Date:  02/12/2022   Name:  Wendy Tucker   DOB:  [birth year]   MRN:  [id number]   Chief Complaint: Rash (Started Saturday with itching on one finger and swelling. Then next day had red spots/ blisters on palm of hands and bottoms of feet. They hurt to stand on them )  Rash This is a new problem. The current episode started in the past 7 days (saturday). The problem has been waxing and waning since onset. The affected locations include the left hand, right foot, right hand and left foot. The rash is characterized by redness and itchiness. Pertinent negatives include no congestion, cough, diarrhea, fatigue, fever, rhinorrhea, shortness of breath or sore throat.    Lab Results  Component Value Date   NA 141 06/15/2021   K 4.1 06/15/2021   CO2 25 06/15/2021   GLUCOSE 110 (H) 06/15/2021   BUN 14 06/15/2021   CREATININE 0.91 06/15/2021   CALCIUM 9.8 06/15/2021   EGFR 68 06/15/2021   GFRNONAA 59 (L) 05/25/2020   Lab Results  Component Value Date   CHOL 195 06/15/2021   HDL 79 06/15/2021   LDLCALC 104 (H) 06/15/2021   TRIG 65 06/15/2021   CHOLHDL 3.3 05/27/2019   Lab Results  Component Value Date   TSH 1.100 05/27/2019   Lab Results  Component Value Date   HGBA1C 6.2 (H) 12/20/2021   Lab Results  Component Value Date   WBC 8.4 02/07/2016   HGB 12.9 02/07/2016   HCT 39.0 02/07/2016   MCV 91 02/07/2016   PLT 206 02/07/2016   Lab Results  Component Value Date   ALT 11 06/15/2021   AST 24 06/15/2021   ALKPHOS 74 06/15/2021   BILITOT 0.6 06/15/2021   Lab Results  Component Value Date   VD25OH 31.9 10/24/2016     Review of Systems  Constitutional: Negative.  Negative for chills, fatigue, fever and unexpected weight change.  HENT:  Negative for congestion, ear discharge, ear pain, rhinorrhea, sinus pressure, sneezing and sore throat.   Respiratory:  Negative for cough, shortness of breath, wheezing and stridor.   Gastrointestinal:  Negative for abdominal pain,  blood in stool, constipation, diarrhea and nausea.  Genitourinary:  Negative for dysuria, flank pain, frequency, hematuria, urgency and vaginal discharge.  Musculoskeletal:  Negative for arthralgias, back pain and myalgias.  Skin:  Positive for rash.  Neurological:  Negative for dizziness, weakness and headaches.  Hematological:  Negative for adenopathy. Does not bruise/bleed easily.  Psychiatric/Behavioral:  Negative for dysphoric mood. The patient is not nervous/anxious.     Patient Active Problem List   Diagnosis Date Noted   Personal history of colonic polyps    Polyp of colon    Special screening for malignant neoplasms, colon    Polyp of sigmoid colon    Benign neoplasm of descending colon    Hyperlipidemia 11/21/2016   Vitamin D deficiency 10/24/2016   Osteopenia 10/24/2016   Diabetes mellitus type 2, controlled, without complications ([place]) 02/58/5277   B12 deficiency 10/24/2016   Obesity (BMI 30.0-34.9) 10/24/2016   FH: Alzheimer's disease 10/24/2016   FH: gastric cancer 10/24/2016   FH: breast cancer 10/24/2016    Allergies  Allergen Reactions   Cephalexin    Amoxicillin Rash    Past Surgical History:  Procedure Laterality Date   BREAST BIOPSY Bilateral    neg- needle bx/clips   BREAST BIOPSY Right 03/18/2019   affirm bx of calcs medial, coil clip, negative   COLONOSCOPY  WITH PROPOFOL N/A 11/28/2016   Procedure: COLONOSCOPY WITH PROPOFOL;  Surgeon: Lucilla Lame, MD;  Location: [facility];  Service: Endoscopy;  Laterality: N/A;   COLONOSCOPY WITH PROPOFOL N/A 11/15/2021   Procedure: COLONOSCOPY WITH BIOPSY;  Surgeon: Lucilla Lame, MD;  Location: [facility];  Service: Endoscopy;  Laterality: N/A;   ECTOPIC PREGNANCY SURGERY     fractured leg Right 2008   has metal rods   POLYPECTOMY  11/28/2016   Procedure: POLYPECTOMY;  Surgeon: Lucilla Lame, MD;  Location: [facility];  Service: Endoscopy;;   POLYPECTOMY N/A 11/15/2021   Procedure:  POLYPECTOMY;  Surgeon: Lucilla Lame, MD;  Location: [facility];  Service: Endoscopy;  Laterality: N/A;    Social History   Tobacco Use   Smoking status: Never   Smokeless tobacco: Never  Vaping Use   Vaping Use: Never used  Substance Use Topics   Alcohol use: Yes    Comment: Occasionally, 2 glass wine/month   Drug use: No     Medication list has been reviewed and updated.  Current Meds  Medication Sig   Accu-Chek Softclix Lancets lancets Check BS 1 time daily   aspirin 81 MG tablet Take 1 tablet (81 mg total) by mouth daily.   cetirizine (ZYRTEC) 10 MG tablet Take 10 mg by mouth daily.   cholecalciferol (VITAMIN D) 1000 units tablet Take 1,000 Units by mouth daily.   glucose blood (ACCU-CHEK GUIDE) test strip TEST ONCE DAILY   metFORMIN (GLUCOPHAGE) 500 MG tablet TAKE 1 TABLET(500 MG) BY MOUTH TWICE DAILY WITH A MEAL   Multiple Minerals-Vitamins (NUTRA-SUPPORT BONE) CAPS Take 1 capsule by mouth daily.   simvastatin (ZOCOR) 10 MG tablet Take 1 tablet (10 mg total) by mouth at bedtime.   vitamin B-12 (CYANOCOBALAMIN) 500 MCG tablet Take 1 tablet (500 mcg total) by mouth daily.       02/12/2022    3:29 PM 12/20/2021    8:05 AM 01/30/2021    8:34 AM 09/11/2020    3:23 PM  GAD 7 : Generalized Anxiety Score  Nervous, Anxious, on Edge 0 0 0 0  Control/stop worrying 0 0 0 0  Worry too much - different things 0 0 0 0  Trouble relaxing 0 0 0 0  Restless 0 0 0 0  Easily annoyed or irritable 0 0 0 0  Afraid - awful might happen 0 0 0 0  Total GAD 7 Score 0 0 0 0  Anxiety Difficulty Not difficult at all Not difficult at all         02/12/2022    3:29 PM 12/20/2021    8:04 AM 11/26/2021    1:50 PM  Depression screen PHQ 2/9  Decreased Interest 3 0 0  Down, Depressed, Hopeless 0 0 0  PHQ - 2 Score 3 0 0  Altered sleeping 0 1   Tired, decreased energy 0 0   Change in appetite 0 0   Feeling bad or failure about yourself  0 0   Trouble concentrating 0 0   Moving slowly or  fidgety/restless 0 0   Suicidal thoughts 0 0   PHQ-9 Score 3 1   Difficult doing work/chores Not difficult at all Not difficult at all     BP Readings from Last 3 Encounters:  02/12/22 110/62  12/20/21 118/68  11/15/21 (!) 108/56    Physical Exam Vitals and nursing note reviewed. Exam conducted with a chaperone present.  Constitutional:      General: She  is not in acute distress.    Appearance: She is not diaphoretic.  HENT:     Head: Normocephalic and atraumatic.     Right Ear: External ear normal.     Left Ear: External ear normal.     Nose: Nose normal.  Eyes:     General:        Right eye: No discharge.        Left eye: No discharge.     Conjunctiva/sclera: Conjunctivae normal.     Pupils: Pupils are equal, round, and reactive to light.  Neck:     Thyroid: No thyromegaly.     Vascular: No JVD.  Cardiovascular:     Rate and Rhythm: Normal rate and regular rhythm.     Heart sounds: Normal heart sounds. No murmur heard.    No friction rub. No gallop.  Pulmonary:     Effort: Pulmonary effort is normal.     Breath sounds: Normal breath sounds.  Abdominal:     General: Bowel sounds are normal.     Palpations: Abdomen is soft. There is no mass.     Tenderness: There is no abdominal tenderness. There is no guarding.  Musculoskeletal:        General: Normal range of motion.     Cervical back: Normal range of motion and neck supple.  Lymphadenopathy:     Cervical: No cervical adenopathy.  Skin:    General: Skin is warm and dry.  Neurological:     Mental Status: She is alert.     Deep Tendon Reflexes: Reflexes are normal and symmetric.     Wt Readings from Last 3 Encounters:  02/12/22 168 lb (76.2 kg)  12/20/21 169 lb (76.7 kg)  11/15/21 165 lb (74.8 kg)    BP 110/62   Pulse 80   Ht _0  (1.727 m)   Wt 168 lb (76.2 kg)   LMP 02/07/1991   BMI 25.54 kg/m   Assessment and Plan:  1. Urticaria  New onset.  Recurrent.  Stable.  On review of almost to the  year patient had urticaria and it was during the initiation of tomato season.  The red splotches on the soles of the feet and palms of the hands that are pruritic are suggesting of hives.  We have suggested Claritin in the morning Benadryl at night, also given her a tapering dose of prednisone and asked that she curtail her tomato intake   Otilio Miu, MD

## 2022-02-13 ENCOUNTER — Encounter: Payer: Self-pay | Admitting: Family Medicine

## 2022-02-14 DIAGNOSIS — H40003 Preglaucoma, unspecified, bilateral: Secondary | ICD-10-CM | POA: Diagnosis not present

## 2022-02-14 LAB — HM DIABETES EYE EXAM

## 2022-03-06 ENCOUNTER — Ambulatory Visit (INDEPENDENT_AMBULATORY_CARE_PROVIDER_SITE_OTHER): Payer: No Typology Code available for payment source | Admitting: Family Medicine

## 2022-03-06 ENCOUNTER — Ambulatory Visit: Payer: Self-pay | Admitting: *Deleted

## 2022-03-06 ENCOUNTER — Other Ambulatory Visit: Payer: Self-pay

## 2022-03-06 ENCOUNTER — Encounter: Payer: Self-pay | Admitting: Family Medicine

## 2022-03-06 VITALS — BP 108/60 | HR 80 | Ht 68.0 in | Wt 169.0 lb

## 2022-03-06 DIAGNOSIS — L509 Urticaria, unspecified: Secondary | ICD-10-CM | POA: Diagnosis not present

## 2022-03-06 MED ORDER — EPINEPHRINE 0.3 MG/0.3ML IJ SOAJ: 0.3000 mg | INTRAMUSCULAR | 1 refills | Status: AC | PRN

## 2022-03-06 NOTE — Telephone Encounter (Signed)
  Chief Complaint: widespread rash Symptoms: itching, swelling Frequency: 2 weeks Pertinent Negatives: Patient denies dizziness, headache, sore throat, joint pain Disposition: '[]'$ ED /'[]'$ Urgent Care (no appt availability in office) / '[x]'$ Appointment(In office/virtual)/ '[]'$  [facility]/ '[]'$ Home Care/ '[]'$ Refused Recommended Disposition /'[]'$ [facility]/ '[]'$  Follow-up with PCP Additional Notes:

## 2022-03-06 NOTE — Progress Notes (Signed)
Date:  03/06/2022   Name:  Wendy Tucker   DOB:  1950-09-11   MRN:  [id number]   Chief Complaint: Rash (Has not been eating tomatoes- finished pred taper approx 2 weeks ago. Has the refill at home in case. Spots come up after she feels a "tingling" and it is raised up)  Rash This is a recurrent problem. The current episode started 1 to 4 weeks ago. The problem has been waxing and waning since onset. The rash is diffuse. The rash is characterized by itchiness. Pertinent negatives include no congestion, fever, rhinorrhea, shortness of breath or sore throat. Past treatments include nothing. The treatment provided moderate relief.    Lab Results  Component Value Date   NA 141 06/15/2021   K 4.1 06/15/2021   CO2 25 06/15/2021   GLUCOSE 110 (H) 06/15/2021   BUN 14 06/15/2021   CREATININE 0.91 06/15/2021   CALCIUM 9.8 06/15/2021   EGFR 68 06/15/2021   GFRNONAA 59 (L) 05/25/2020   Lab Results  Component Value Date   CHOL 195 06/15/2021   HDL 79 06/15/2021   LDLCALC 104 (H) 06/15/2021   TRIG 65 06/15/2021   CHOLHDL 3.3 05/27/2019   Lab Results  Component Value Date   TSH 1.100 05/27/2019   Lab Results  Component Value Date   HGBA1C 6.2 (H) 12/20/2021   Lab Results  Component Value Date   WBC 8.4 02/07/2016   HGB 12.9 02/07/2016   HCT 39.0 02/07/2016   MCV 91 02/07/2016   PLT 206 02/07/2016   Lab Results  Component Value Date   ALT 11 06/15/2021   AST 24 06/15/2021   ALKPHOS 74 06/15/2021   BILITOT 0.6 06/15/2021   Lab Results  Component Value Date   VD25OH 31.9 10/24/2016     Review of Systems  Constitutional:  Negative for chills and fever.  HENT:  Negative for congestion, rhinorrhea, sneezing and sore throat.   Respiratory:  Negative for chest tightness, shortness of breath and wheezing.   Cardiovascular:  Negative for chest pain, palpitations and leg swelling.  Skin:  Positive for rash.    Patient Active Problem List   Diagnosis Date Noted    Personal history of colonic polyps    Polyp of colon    Special screening for malignant neoplasms, colon    Polyp of sigmoid colon    Benign neoplasm of descending colon    Hyperlipidemia 11/21/2016   Vitamin D deficiency 10/24/2016   Osteopenia 10/24/2016   Diabetes mellitus type 2, controlled, without complications ([place]) 26/33/3545   B12 deficiency 10/24/2016   Obesity (BMI 30.0-34.9) 10/24/2016   FH: Alzheimer's disease 10/24/2016   FH: gastric cancer 10/24/2016   FH: breast cancer 10/24/2016    Allergies  Allergen Reactions   Cephalexin    Amoxicillin Rash    Past Surgical History:  Procedure Laterality Date   BREAST BIOPSY Bilateral    neg- needle bx/clips   BREAST BIOPSY Right 03/18/2019   affirm bx of calcs medial, coil clip, negative   COLONOSCOPY WITH PROPOFOL N/A 11/28/2016   Procedure: COLONOSCOPY WITH PROPOFOL;  Surgeon: Lucilla Lame, MD;  Location: [facility];  Service: Endoscopy;  Laterality: N/A;   COLONOSCOPY WITH PROPOFOL N/A 11/15/2021   Procedure: COLONOSCOPY WITH BIOPSY;  Surgeon: Lucilla Lame, MD;  Location: [facility];  Service: Endoscopy;  Laterality: N/A;   ECTOPIC PREGNANCY SURGERY     fractured leg Right 2008   has metal rods   POLYPECTOMY  11/28/2016   Procedure: POLYPECTOMY;  Surgeon: Lucilla Lame, MD;  Location: [facility];  Service: Endoscopy;;   POLYPECTOMY N/A 11/15/2021   Procedure: POLYPECTOMY;  Surgeon: Lucilla Lame, MD;  Location: [facility];  Service: Endoscopy;  Laterality: N/A;    Social History   Tobacco Use   Smoking status: Never   Smokeless tobacco: Never  Vaping Use   Vaping Use: Never used  Substance Use Topics   Alcohol use: Yes    Comment: Occasionally, 2 glass wine/month   Drug use: No     Medication list has been reviewed and updated.  Current Meds  Medication Sig   Accu-Chek Softclix Lancets lancets Check BS 1 time daily   aspirin 81 MG tablet Take 1 tablet (81 mg total) by  mouth daily.   cetirizine (ZYRTEC) 10 MG tablet Take 10 mg by mouth daily.   cholecalciferol (VITAMIN D) 1000 units tablet Take 1,000 Units by mouth daily.   glucose blood (ACCU-CHEK GUIDE) test strip TEST ONCE DAILY   metFORMIN (GLUCOPHAGE) 500 MG tablet TAKE 1 TABLET(500 MG) BY MOUTH TWICE DAILY WITH A MEAL   Multiple Minerals-Vitamins (NUTRA-SUPPORT BONE) CAPS Take 1 capsule by mouth daily.   simvastatin (ZOCOR) 10 MG tablet Take 1 tablet (10 mg total) by mouth at bedtime.   triamcinolone cream (KENALOG) 0.1 % Apply 1 Application topically 2 (two) times daily.   vitamin B-12 (CYANOCOBALAMIN) 500 MCG tablet Take 1 tablet (500 mcg total) by mouth daily.       03/06/2022   11:36 AM 02/12/2022    3:29 PM 12/20/2021    8:05 AM 01/30/2021    8:34 AM  GAD 7 : Generalized Anxiety Score  Nervous, Anxious, on Edge 0 0 0 0  Control/stop worrying 0 0 0 0  Worry too much - different things 0 0 0 0  Trouble relaxing 0 0 0 0  Restless 0 0 0 0  Easily annoyed or irritable 0 0 0 0  Afraid - awful might happen 0 0 0 0  Total GAD 7 Score 0 0 0 0  Anxiety Difficulty Not difficult at all Not difficult at all Not difficult at all        03/06/2022   11:36 AM 02/12/2022    3:29 PM 12/20/2021    8:04 AM  Depression screen PHQ 2/9  Decreased Interest 0 3 0  Down, Depressed, Hopeless 0 0 0  PHQ - 2 Score 0 3 0  Altered sleeping 0 0 1  Tired, decreased energy 0 0 0  Change in appetite 0 0 0  Feeling bad or failure about yourself  0 0 0  Trouble concentrating 0 0 0  Moving slowly or fidgety/restless 0 0 0  Suicidal thoughts 0 0 0  PHQ-9 Score 0 3 1  Difficult doing work/chores Not difficult at all Not difficult at all Not difficult at all    BP Readings from Last 3 Encounters:  03/06/22 108/60  02/12/22 110/62  12/20/21 118/68    Physical Exam Vitals and nursing note reviewed. Exam conducted with a chaperone present.  Constitutional:      General: She is not in acute distress.    Appearance:  She is not diaphoretic.  HENT:     Head: Normocephalic and atraumatic.     Right Ear: External ear normal.     Left Ear: External ear normal.     Nose: Nose normal.  Eyes:     General:  Right eye: No discharge.        Left eye: No discharge.     Conjunctiva/sclera: Conjunctivae normal.     Pupils: Pupils are equal, round, and reactive to light.  Neck:     Thyroid: No thyromegaly.     Vascular: No JVD.  Cardiovascular:     Rate and Rhythm: Normal rate and regular rhythm.     Heart sounds: Normal heart sounds.  Pulmonary:     Effort: Pulmonary effort is normal.     Breath sounds: Normal breath sounds. No decreased breath sounds, wheezing or rhonchi.  Abdominal:     Palpations: Abdomen is soft. There is no mass.     Tenderness: There is no abdominal tenderness. There is no guarding.  Musculoskeletal:        General: Normal range of motion.     Cervical back: Normal range of motion and neck supple.  Lymphadenopathy:     Cervical: No cervical adenopathy.  Skin:    General: Skin is warm and dry.  Neurological:     Mental Status: She is alert.     Wt Readings from Last 3 Encounters:  03/06/22 169 lb (76.7 kg)  02/12/22 168 lb (76.2 kg)  12/20/21 169 lb (76.7 kg)    BP 108/60   Pulse 80   Ht '5\' 8"'  (1.727 m)   Wt 169 lb (76.7 kg)   LMP 02/07/1991   BMI 25.70 kg/m   Assessment and Plan:  1. Urticaria New onset.  Occurred several weeks ago and has been persistent in nature.  Patient completed her prednisone taper but has refilled it but has not started the second dosing and that we are in the process of elimination of substances that may be causing the urticaria recurrent.  Patient has been instructed that she is to make a list of everything that she eats or drinks and if everything she puts on her scan.  She has been instructed to go with a hypoallergenic detergent and a very basic soap such as [place] without fragrance or dyes.  Patient has been instructed to  discontinue all her medications even over-the-counter which includes metformin, statin, and aspirin.  We have made a appointment with dermatology to assist [place] in determination of this rash.  In the meantime patient awoke with a feeling 1 night of tightness in her throat which was not associated with stridor or wheezing.  We have prescribed EpiPen's 1 to keep at her nightstand and the other to keep on her possession such as her hand bag.  Patient is to return the further issues.  She has been instructed that she may take her Zyrtec in the morning and Benadryl at night as previously prescribed after she developed the symptoms. - EPINEPHrine 0.3 mg/0.3 mL IJ SOAJ injection; Inject 0.3 mg into the muscle as needed for anaphylaxis.  Dispense: 1 each; Refill: 1    Otilio Miu, MD

## 2022-03-06 NOTE — Patient Instructions (Signed)
Anaphylactic Reaction, Adult An anaphylactic reaction (anaphylaxis) is a sudden, severe allergic reaction by the body's disease-fighting system (immune system). Anaphylaxis can be life-threatening. This condition must be treated right away. Sometimes a person may need to be treated in the hospital. What are the causes? This condition is caused by exposure to a substance that you are allergic to (allergen). In response to this exposure, the body releases proteins (antibodies) and other compounds, such as histamine, into the bloodstream. This causes swelling in certain tissues and loss of blood pressure to important areas, such as the heart and lungs. Common allergens that can cause anaphylaxis include: Foods, especially peanuts, wheat, shellfish, milk, and eggs. Medicines. Insect bites or stings. Blood or parts of blood received for treatment (transfusions). Chemicals, such as dyes, latex, and contrast material that is used for medical tests. What increases the risk? This condition is more likely to occur in people who: Have allergies. Have had anaphylaxis before. Have a family history of anaphylaxis. Have certain medical conditions, including asthma and eczema. What are the signs or symptoms? Symptoms of anaphylaxis may include: Feeling warm in the face (flushed). This may include redness. Itchy, red, swollen areas of skin (hives). Swelling of the eyes, lips, face, mouth, tongue, or throat. Difficulty breathing, speaking, or swallowing. Dizziness, light-headedness, or fainting. Pain or cramping in the abdomen. Vomiting or diarrhea. How is this diagnosed? This condition is diagnosed based on: Your symptoms. A physical exam. Blood tests. Recent history of exposure to allergens. How is this treated? If you think you are having an anaphylactic reaction, you should do the following right away: Give yourself an epinephrine injection using what is commonly called an auto-injector "pen"  (pre-filled automatic epinephrine injection device). Your health care provider will teach you how to use an auto-injector pen. Call for emergency help. If you use a pen, you must still get emergency medical treatment in the hospital. Treatment in the hospital may include: Medicines to help: Tighten your blood vessels (epinephrine). Relieve itching and hives (antihistamines). Reduce swelling (corticosteroids). Oxygen therapy to help you breathe. IV fluids to keep you hydrated. Follow these instructions at home: Safety Always keep an auto-injector pen near you. This can be lifesaving if you have a severe anaphylactic reaction. Use your auto-injector pen as told by your health care provider. Do not drive after an anaphylactic reaction until your health care provider approves. Make sure that you, the members of your household, and your employer know: What you are allergic to, so it can be avoided. How to use an auto-injector pen to give you an epinephrine injection. Replace your epinephrine immediately after you use your auto-injector pen. This is important if you have another reaction. If possible, carry two epinephrine auto-injector pens. If told by your health care provider, wear a medical alert bracelet or necklace that states your allergy. Learn the signs of anaphylaxis so that you can recognize and treat it right away. Work with your health care providers to make an anaphylaxis plan. Preparation is important. General instructions Tell all your health care providers that you have an allergy. If you have hives or rash: Use an over-the-counter antihistamine as told by your health care provider. Apply cold, wet cloths (cold compresses) to your skin or take baths or showers in cool water. Avoid hot water. Take over-the-counter and prescription medicines only as told by your health care provider. Keep all follow-up visits as told by your health care provider. This is important. How is this  prevented? Avoid allergens   that have caused an anaphylactic reaction in the past. When you are at a restaurant, tell your server that you have an allergy. If you are not sure whether a menu item contains an ingredient that you are allergic to, ask your server. Where to find more information American Academy of Allergy, Asthma and Immunology: aaaai.org American Academy of Pediatrics: healthychildren.org Get help right away if: You develop symptoms of an allergic reaction. You may notice them soon after you are exposed to a substance. You used epinephrine. You need more medical care even if the medicine seems to be working. This is important because anaphylaxis may happen again within 72 hours (rebound anaphylaxis). You also may need more doses of epinephrine. These symptoms may represent a serious problem that is an emergency. Do not wait to see if the symptoms will go away. Do the following right away: Use the auto-injector pen as you have been instructed. Get medical help. Call your local emergency services (911 in the [place] Do not drive yourself to the hospital. Summary An anaphylactic reaction (anaphylaxis) is a sudden, severe allergic reaction by the body's disease-fighting system (immune system). This condition can be life-threatening. If you have an anaphylactic reaction, get medical help right away. Your health care provider may teach you how to use an auto-injector "pen" (pre-filled automatic epinephrine injection device) to give yourself a shot. Always keep an auto-injector pen with you. This could save your life. Use it as told by your health care provider. If you use epinephrine, you must still get emergency medical treatment, even if the medicine seems to be working. This information is not intended to replace advice given to you by your health care provider. Make sure you discuss any questions you have with your health care provider. Document Revised: 08/10/2020 Document Reviewed:  08/10/2020 Elsevier Patient Education  2023 [employer]vier Inc.  

## 2022-03-06 NOTE — Telephone Encounter (Signed)
Summary: rash   Pt states she has an ongoing rash on her stomach, hand, arm, and neck x3w   Pt states she was treated for the rash but had a reaction to the topical cream she was given   Please fu w/ pt      Reason for Disposition  Mild widespread rash  (Exception: Heat rash lasting 3 days or less.)  Answer Assessment - Initial Assessment Questions 1. APPEARANCE of RASH: "Describe the rash." (e.g., spots, blisters, raised areas, skin peeling, scaly)     [place], large areas 2. SIZE: "How big are the spots?" (e.g., tip of pen, eraser, coin; inches, centimeters)     Large area 3. LOCATION: "Where is the rash located?"     Neck, chest, hand, under arm 4. COLOR: "What color is the rash?" (Note: It is difficult to assess rash color in people with darker-colored skin. When this situation occurs, simply ask the caller to describe what they see.)     red 5. ONSET: "When did the rash begin?"     2-3 weeks 6. FEVER: "Do you have a fever?" If Yes, ask: "What is your temperature, how was it measured, and when did it start?"     no 7. ITCHING: "Does the rash itch?" If Yes, ask: "How bad is the itch?" (Scale 1-10; or mild, moderate, severe)     Using cream for itching- mild 8. CAUSE: "What do you think is causing the rash?"     unsure 9. MEDICINE FACTORS: "Have you started any new medicines within the last 2 weeks?" (e.g., antibiotics)      no 10. OTHER SYMPTOMS: "Do you have any other symptoms?" (e.g., dizziness, headache, sore throat, joint pain)       Prednisone- SE- throat swelling, stomach pain/chest pain  Protocols used: Rash or Redness - [facility]

## 2022-03-07 ENCOUNTER — Telehealth: Payer: Self-pay | Admitting: Family Medicine

## 2022-03-07 NOTE — Telephone Encounter (Signed)
Copied from [place] [id number]. Topic: General - Inquiry >> Mar 07, 2022  3:57 PM Rosanne Ashing P wrote: Reason for CRM: Pt called saying she can not afford the epi pen that was sent in th pharmacy yesterday.  She wants to know if something else can be sent in.

## 2022-03-08 ENCOUNTER — Telehealth: Payer: Self-pay

## 2022-03-08 DIAGNOSIS — R21 Rash and other nonspecific skin eruption: Secondary | ICD-10-CM | POA: Diagnosis not present

## 2022-03-08 NOTE — Telephone Encounter (Signed)
Pt called asking about an alternative to epi- pen. I told her there was not one that we know of, but she could price it at [facility] and see if it is cheaper or ask them the pay out of pocket price. She is to let me know if it is cheaper at [facility] and I will send it in there.

## 2022-03-18 ENCOUNTER — Ambulatory Visit: Payer: Self-pay

## 2022-03-18 ENCOUNTER — Telehealth: Payer: Self-pay | Admitting: Family Medicine

## 2022-03-18 NOTE — Telephone Encounter (Signed)
Summary: Bad rash and ineffective medications   Patient called ins stating she still has a really bad rash and the prescriptions her provider gave her for prednisone and the kenalog cream didn't help at all. She also states that the dermatologist she was referred to prescribed her two other medications and they didn't help either. She wonders if a referral to an allergist in [place] maybe beneficial at this time. Please assist patient further.      Chief Complaint: rash Symptoms: red raised rash to right arm and tight side of back Frequency: since 02/12/22 Pertinent Negatives: Patient denies no fever, itching, dizziness, headache, peeling skin to soles of feet or palms, blisters, sore throat or joint pain Disposition: '[]'$ ED /'[]'$ Urgent Care (no appt availability in office) / '[]'$ Appointment(In office/virtual)/ '[]'$  [place] [facility]/ '[]'$ Home Care/ '[]'$ Refused Recommended Disposition /'[]'$ [place] [facility]/ '[x]'$  Follow-up with PCP Additional Notes: pt stated rash now moves to different areas to her body - the rash will disappear for an area and the next day rash is gone at that are and moved elsewhere. Advised pt, office is working to get pt appt with allergist.    Reason for Disposition  [1] Purple or blood-colored rash (spots or dots) AND [2] no fever AND [3] sounds well to triager  Answer Assessment - Initial Assessment Questions 1. APPEARANCE of RASH: "Describe the rash." (e.g., spots, blisters, raised areas, skin peeling, scaly)     Raised area, big spots that turn red 2. SIZE: "How big are the spots?" (e.g., tip of pen, eraser, coin; inches, centimeters)     Arm 2 1/2 long 1/1/2 wide one back  3. LOCATION: "Where is the rash located?"     Changes places- right arm and right side of back  4. COLOR: "What color is the rash?" (Note: It is difficult to assess rash color in people with darker-colored skin. When this situation occurs, simply ask the caller to describe what they see.)     red 5.  ONSET: "When did the rash begin?"     02/12/22 6. FEVER: "Do you have a fever?" If Yes, ask: "What is your temperature, how was it measured, and when did it start?"     no 7. ITCHING: "Does the rash itch?" If Yes, ask: "How bad is the itch?" (Scale 1-10; or mild, moderate, severe)     no 8. CAUSE: "What do you think is causing the rash?"     unknown 9. MEDICINE FACTORS: "Have you started any new medicines within the last 2 weeks?" (e.g., antibiotics)      Just prednisone and kenalog cream  10. OTHER SYMPTOMS: "Do you have any other symptoms?" (e.g., dizziness, headache, sore throat, joint pain)       no 11. PREGNANCY: "Is there any chance you are pregnant?" "When was your last menstrual period?"       N/a  Protocols used: Rash or Redness - [facility]

## 2022-03-18 NOTE — Telephone Encounter (Signed)
Copied from [place] [id number]. Topic: Referral - Request for Referral >> Mar 18, 2022  8:38 AM Erskine Squibb wrote: Has patient seen PCP for this complaint? Yes.    Referral for which specialty: Allergist Preferred provider/office: Allergist in [place] Reason for referral: Bad rash  Patient states she saw a dermatologist and prescriptions from her primary provider and dermatologist has not helped her and has request a referral to an alergist in [place]. Please assist patient further

## 2022-03-27 ENCOUNTER — Other Ambulatory Visit: Payer: Self-pay | Admitting: Family Medicine

## 2022-03-27 DIAGNOSIS — Z1231 Encounter for screening mammogram for malignant neoplasm of breast: Secondary | ICD-10-CM

## 2022-05-09 ENCOUNTER — Ambulatory Visit
Admission: RE | Admit: 2022-05-09 | Discharge: 2022-05-09 | Disposition: A | Discharge status: Home / Self Care | Payer: No Typology Code available for payment source | Source: Ambulatory Visit | Attending: Family Medicine | Admitting: Family Medicine

## 2022-05-09 DIAGNOSIS — Z1231 Encounter for screening mammogram for malignant neoplasm of breast: Secondary | ICD-10-CM | POA: Insufficient documentation

## 2022-05-10 ENCOUNTER — Other Ambulatory Visit: Payer: Self-pay | Admitting: Family Medicine

## 2022-05-10 DIAGNOSIS — R928 Other abnormal and inconclusive findings on diagnostic imaging of breast: Secondary | ICD-10-CM

## 2022-05-10 DIAGNOSIS — N6489 Other specified disorders of breast: Secondary | ICD-10-CM

## 2022-05-20 ENCOUNTER — Ambulatory Visit: Payer: Self-pay | Admitting: *Deleted

## 2022-05-20 NOTE — Telephone Encounter (Signed)
  Caller is conducting patient medication adherence and was informed by the patient that PCP d/c patient metformin 500 MG. Caller seeking confirmation      Left VM with Cecille Rubin to call back  Cecille Rubin from [place] phone - [phone]

## 2022-05-20 NOTE — Telephone Encounter (Signed)
Summary: Medication Managment   Caller is conducting patient medication adherence and was informed by the patient that PCP d/c patient metformin 500 MG. Caller seeking confirmation       Chief Complaint: requesting confirmation if patient is supposed to be taking metformin 500 mg twice daily. Patient reports to [facility] advantage plan rep. Cecille Rubin she no longer takes metformin.  Symptoms: na Frequency: na Pertinent Negatives: Patient denies na Disposition: '[]'$ ED /'[]'$ Urgent Care (no appt availability in office) / '[]'$ Appointment(In office/virtual)/ '[]'$  [place] [facility]/ '[]'$ Home Care/ '[]'$ Refused Recommended Disposition /'[]'$ [place] [facility]/ '[x]'$  Follow-up with PCP Additional Notes:   Please confirm patient is to continue taking metformin 500 mg twice daily. Patient reports to Cecille Rubin from [facility] she is no longer taking medication. Please advise and f/u with pharmacy and patient.      Reason for Disposition  [1] Caller has URGENT medicine question about med that PCP or specialist prescribed AND [2] triager unable to answer question  Answer Assessment - Initial Assessment Questions 1. NAME of MEDICINE: "What medicine(s) are you calling about?"     Metformin 500 mg twice daily  2. QUESTION: "What is your question?" (e.g., double dose of medicine, side effect)     [facility] requesting confirmation if patient is still to take metformin. Patient reports she no longer take the medication. 3. PRESCRIBER: "Who prescribed the medicine?" Reason: if prescribed by specialist, call should be referred to that group.     PCP 4. SYMPTOMS: "Do you have any symptoms?" If Yes, ask: "What symptoms are you having?"  "How bad are the symptoms (e.g., mild, moderate, severe)     na 5. PREGNANCY:  "Is there any chance that you are pregnant?" "When was your last menstrual period?"     na  Protocols used: Medication Question Call-A-AH

## 2022-05-22 ENCOUNTER — Ambulatory Visit
Admission: RE | Admit: 2022-05-22 | Discharge: 2022-05-22 | Disposition: A | Discharge status: Home / Self Care | Payer: No Typology Code available for payment source | Source: Ambulatory Visit | Attending: Family Medicine | Admitting: Family Medicine

## 2022-05-22 DIAGNOSIS — R921 Mammographic calcification found on diagnostic imaging of breast: Secondary | ICD-10-CM | POA: Diagnosis not present

## 2022-05-22 DIAGNOSIS — R928 Other abnormal and inconclusive findings on diagnostic imaging of breast: Secondary | ICD-10-CM

## 2022-05-22 DIAGNOSIS — N6489 Other specified disorders of breast: Secondary | ICD-10-CM | POA: Diagnosis not present

## 2022-06-03 DIAGNOSIS — L501 Idiopathic urticaria: Secondary | ICD-10-CM | POA: Diagnosis not present

## 2022-06-10 IMAGING — MG DIGITAL SCREENING BILAT W/ TOMO W/ CAD
8 series · 8 of 24 positions shown · non-contrast
Comparison: Previous exam(s).

CLINICAL DATA: Screening.

EXAM:
DIGITAL SCREENING BILATERAL MAMMOGRAM WITH TOMO AND CAD

[R CC synth-2D]
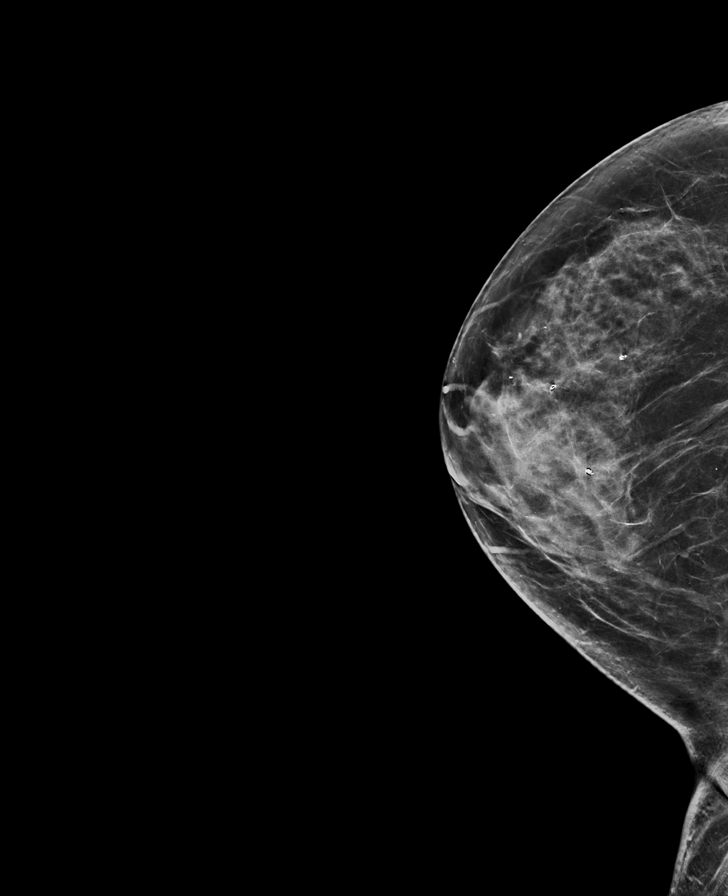

[L CC synth-2D]
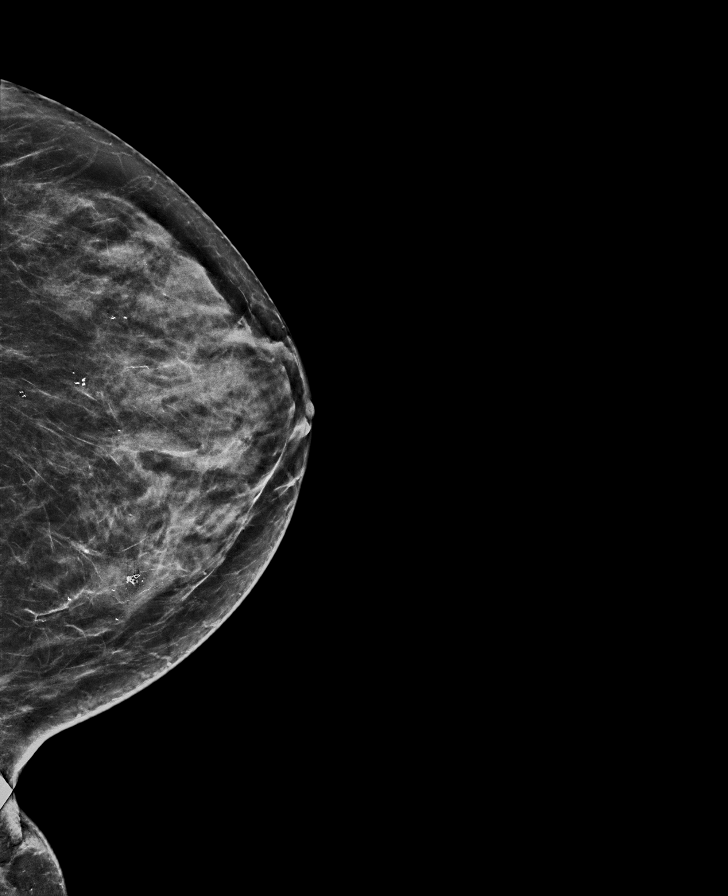

[L MLO synth-2D]
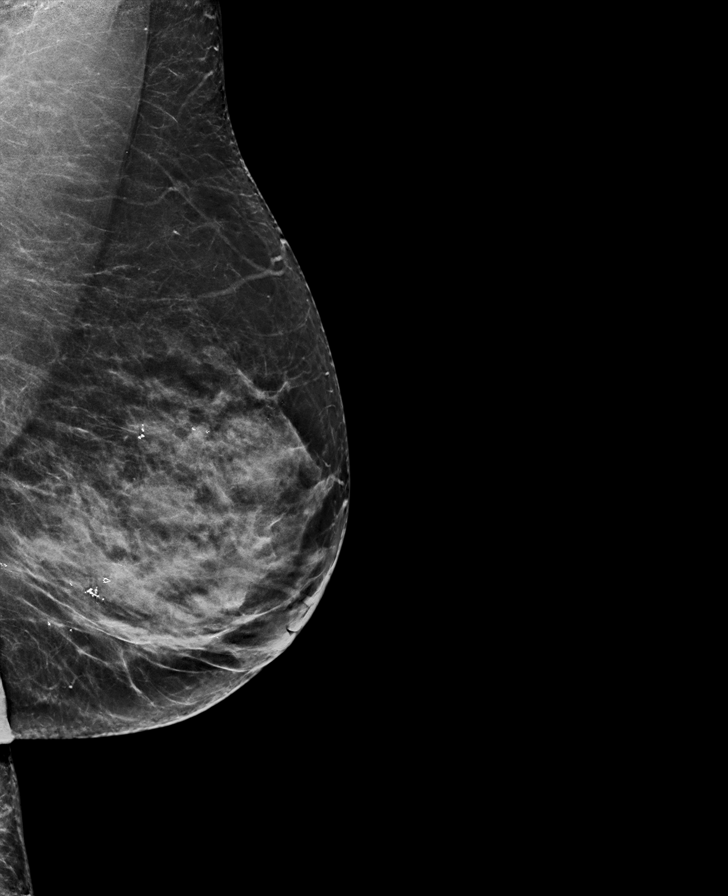

[R MLO synth-2D]
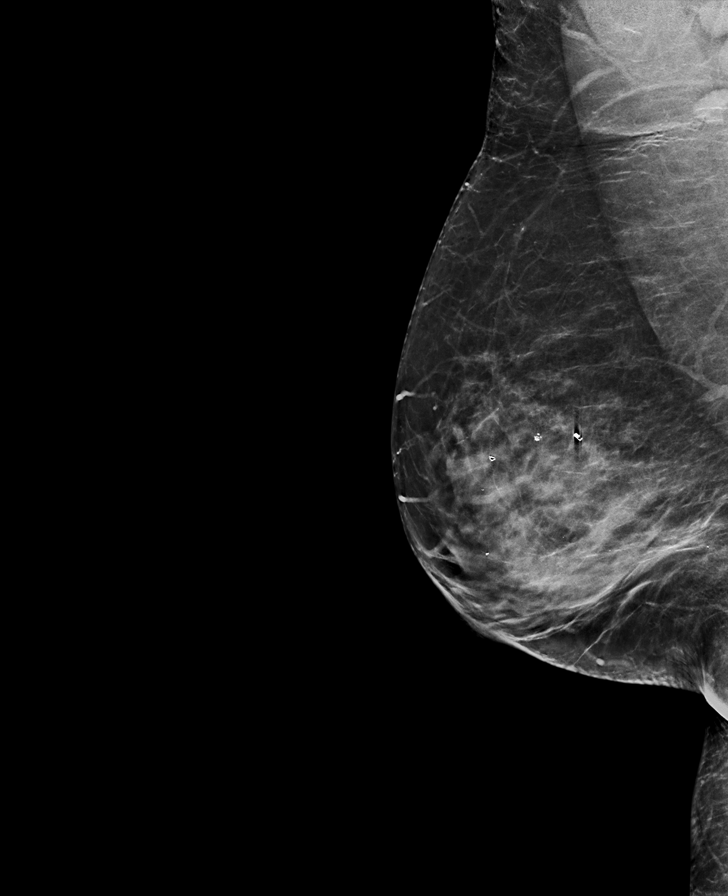

[R MLO tomo · tomo slice 37/73.0]
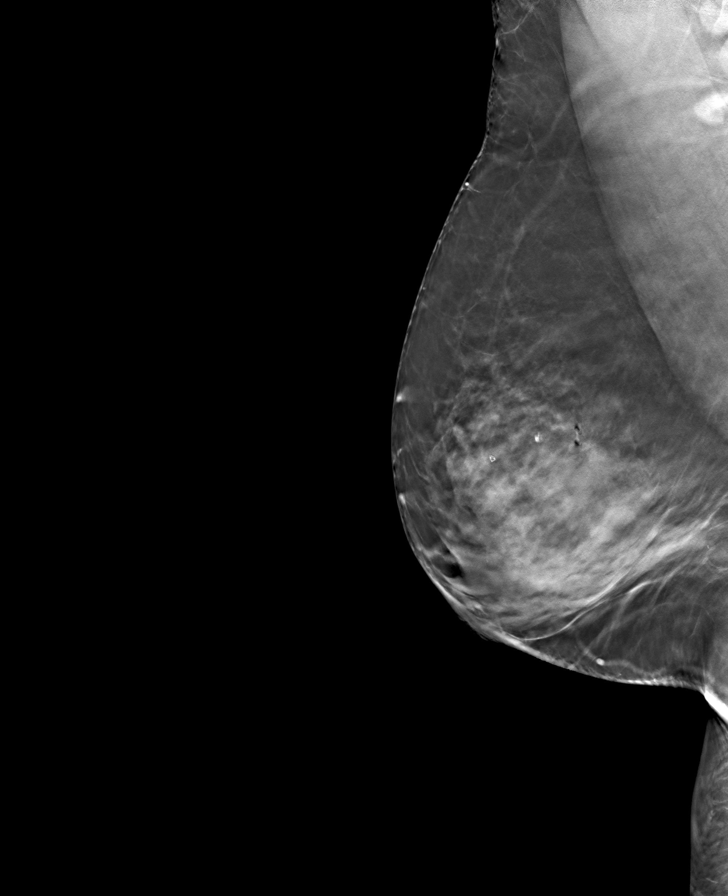

[L MLO tomo · tomo slice 35/70.0]
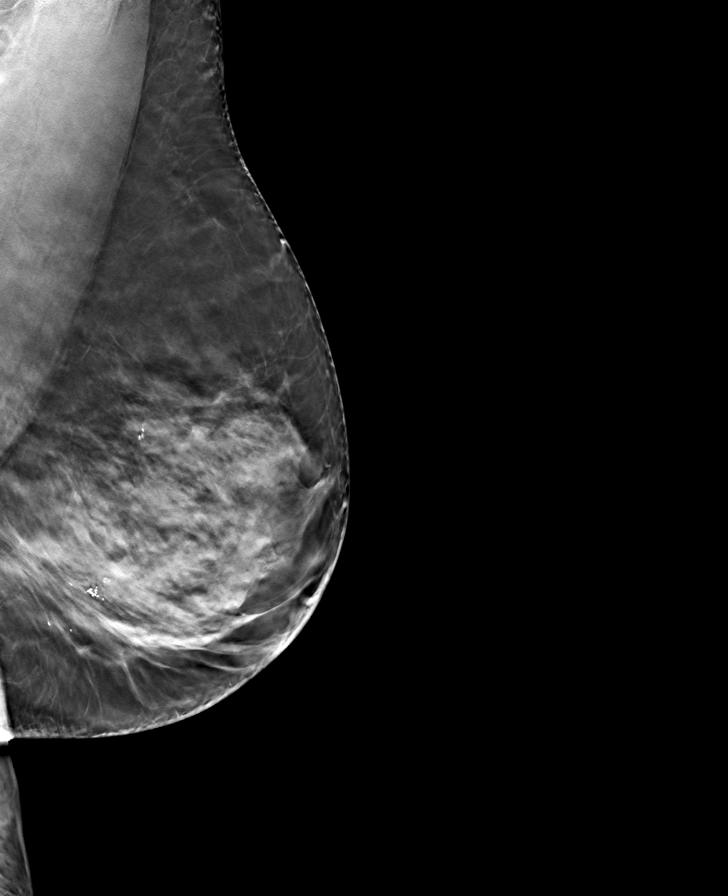

[L CC tomo · tomo slice 33/64.0]
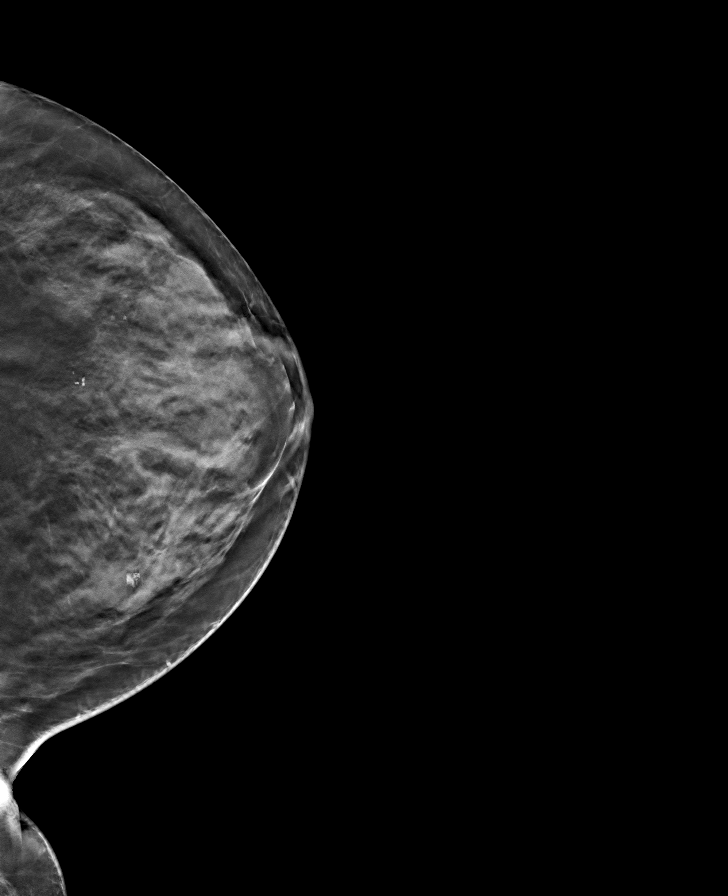

[R CC tomo · tomo slice 35/69.0]
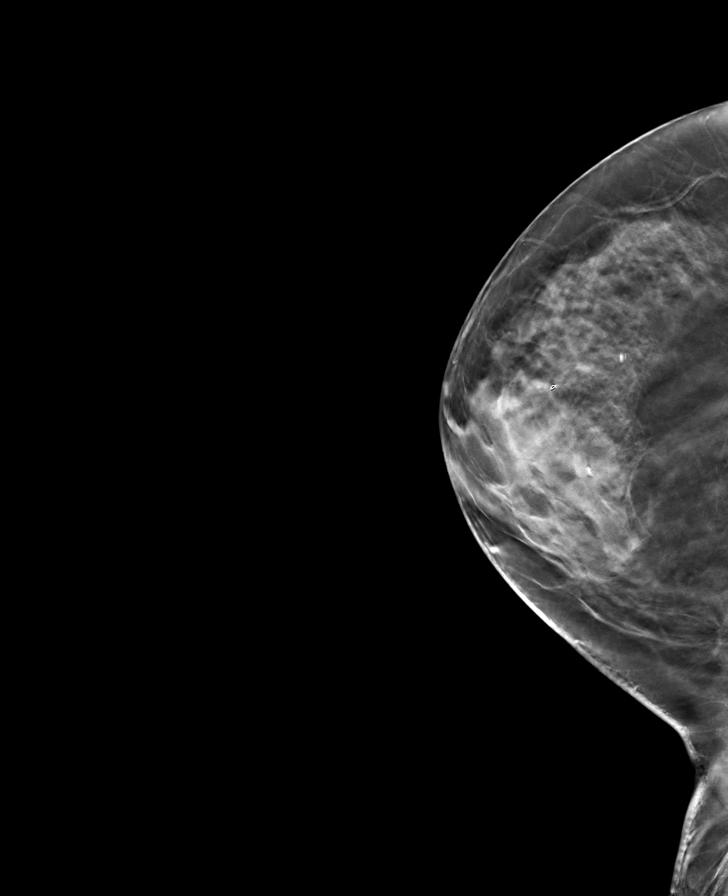

[8 of 24 positions shown; findings below may reference images not displayed]

ACR Breast Density Category c: The breast tissue is heterogeneously
dense, which may obscure small masses.
FINDINGS: There are no findings suspicious for malignancy. Images were
processed with CAD.
IMPRESSION: No mammographic evidence of malignancy. A result letter of this
screening mammogram will be mailed directly to the patient.

RECOMMENDATION:
Screening mammogram in one year. (Code:[id number]-[id number])

BI-RADS CATEGORY  1: Negative.

## 2022-06-18 DIAGNOSIS — Z86010 Personal history of colonic polyps: Secondary | ICD-10-CM | POA: Diagnosis not present

## 2022-06-18 DIAGNOSIS — E1122 Type 2 diabetes mellitus with diabetic chronic kidney disease: Secondary | ICD-10-CM | POA: Diagnosis not present

## 2022-06-18 DIAGNOSIS — E1136 Type 2 diabetes mellitus with diabetic cataract: Secondary | ICD-10-CM | POA: Diagnosis not present

## 2022-06-18 DIAGNOSIS — Z008 Encounter for other general examination: Secondary | ICD-10-CM | POA: Diagnosis not present

## 2022-06-18 DIAGNOSIS — E1169 Type 2 diabetes mellitus with other specified complication: Secondary | ICD-10-CM | POA: Diagnosis not present

## 2022-06-18 DIAGNOSIS — Z6826 Body mass index (BMI) 26.0-26.9, adult: Secondary | ICD-10-CM | POA: Diagnosis not present

## 2022-06-18 DIAGNOSIS — E663 Overweight: Secondary | ICD-10-CM | POA: Diagnosis not present

## 2022-06-18 DIAGNOSIS — E785 Hyperlipidemia, unspecified: Secondary | ICD-10-CM | POA: Diagnosis not present

## 2022-06-18 DIAGNOSIS — N182 Chronic kidney disease, stage 2 (mild): Secondary | ICD-10-CM | POA: Diagnosis not present

## 2022-06-20 DIAGNOSIS — L508 Other urticaria: Secondary | ICD-10-CM | POA: Diagnosis not present

## 2022-06-21 ENCOUNTER — Ambulatory Visit: Payer: No Typology Code available for payment source | Admitting: Family Medicine

## 2022-07-11 ENCOUNTER — Ambulatory Visit (INDEPENDENT_AMBULATORY_CARE_PROVIDER_SITE_OTHER): Payer: No Typology Code available for payment source | Admitting: Family Medicine

## 2022-07-11 ENCOUNTER — Encounter: Payer: Self-pay | Admitting: Family Medicine

## 2022-07-11 DIAGNOSIS — E7801 Familial hypercholesterolemia: Secondary | ICD-10-CM | POA: Diagnosis not present

## 2022-07-11 DIAGNOSIS — R7303 Prediabetes: Secondary | ICD-10-CM | POA: Diagnosis not present

## 2022-07-11 MED ORDER — METFORMIN HCL 500 MG PO TABS: ORAL_TABLET | ORAL | 1 refills | Status: DC

## 2022-07-11 MED ORDER — SIMVASTATIN 10 MG PO TABS: 10.0000 mg | ORAL_TABLET | Freq: Every day | ORAL | 1 refills | Status: DC

## 2022-07-11 NOTE — Progress Notes (Signed)
Date:  07/11/2022   Name:  Wendy Tucker   DOB:  [birth year]   MRN:  [id number]   Chief Complaint: Hyperlipidemia and Prediabetes  Hyperlipidemia This is a chronic problem. The current episode started more than 1 year ago. The problem is controlled. Recent lipid tests were reviewed and are normal. She has no history of chronic renal disease, diabetes, hypothyroidism, liver disease, obesity or nephrotic syndrome. There are no known factors aggravating her hyperlipidemia. Pertinent negatives include no chest pain, focal sensory loss, focal weakness, leg pain, myalgias or shortness of breath. Current antihyperlipidemic treatment includes statins. The current treatment provides moderate improvement of lipids. There are no compliance problems.  Risk factors for coronary artery disease include diabetes mellitus and dyslipidemia.  Diabetes She presents for her follow-up diabetic visit. Diabetes type: prediabetes. Her disease course has been stable. There are no hypoglycemic associated symptoms. Pertinent negatives for diabetes include no blurred vision, no chest pain, no fatigue, no foot paresthesias, no foot ulcerations, no polydipsia, no polyphagia, no polyuria, no visual change, no weakness and no weight loss. There are no hypoglycemic complications. Symptoms are stable. There are no diabetic complications. Risk factors for coronary artery disease include dyslipidemia and hypertension. Current diabetic treatment includes oral agent (monotherapy). She is following a generally healthy diet. Meal planning includes avoidance of concentrated sweets and carbohydrate counting. Her home blood glucose trend is fluctuating minimally. Her breakfast blood glucose range is generally 90-110 mg/dl. An ACE inhibitor/angiotensin II receptor blocker is not being taken.    Lab Results  Component Value Date   NA 141 06/15/2021   K 4.1 06/15/2021   CO2 25 06/15/2021   GLUCOSE 110 (H) 06/15/2021   BUN 14 06/15/2021    CREATININE 0.91 06/15/2021   CALCIUM 9.8 06/15/2021   EGFR 68 06/15/2021   GFRNONAA 59 (L) 05/25/2020   Lab Results  Component Value Date   CHOL 195 06/15/2021   HDL 79 06/15/2021   LDLCALC 104 (H) 06/15/2021   TRIG 65 06/15/2021   CHOLHDL 3.3 05/27/2019   Lab Results  Component Value Date   TSH 1.100 05/27/2019   Lab Results  Component Value Date   HGBA1C 6.2 (H) 12/20/2021   Lab Results  Component Value Date   WBC 8.4 02/07/2016   HGB 12.9 02/07/2016   HCT 39.0 02/07/2016   MCV 91 02/07/2016   PLT 206 02/07/2016   Lab Results  Component Value Date   ALT 11 06/15/2021   AST 24 06/15/2021   ALKPHOS 74 06/15/2021   BILITOT 0.6 06/15/2021   Lab Results  Component Value Date   VD25OH 31.9 10/24/2016     Review of Systems  Constitutional:  Negative for fatigue and weight loss.  HENT:  Negative for trouble swallowing.   Eyes:  Negative for blurred vision and visual disturbance.  Respiratory:  Negative for chest tightness, shortness of breath and wheezing.   Cardiovascular:  Negative for chest pain, palpitations and leg swelling.  Gastrointestinal:  Negative for blood in stool, constipation and diarrhea.  Endocrine: Negative for polydipsia, polyphagia and polyuria.  Genitourinary:  Negative for difficulty urinating.  Musculoskeletal:  Negative for myalgias.  Neurological:  Negative for focal weakness and weakness.    Patient Active Problem List   Diagnosis Date Noted   Personal history of colonic polyps    Polyp of colon    Special screening for malignant neoplasms, colon    Polyp of sigmoid colon    Benign neoplasm of descending  colon    Hyperlipidemia 11/21/2016   Vitamin D deficiency 10/24/2016   Osteopenia 10/24/2016   Diabetes mellitus type 2, controlled, without complications ([place]) 04/23/5101   B12 deficiency 10/24/2016   Obesity (BMI 30.0-34.9) 10/24/2016   FH: Alzheimer's disease 10/24/2016   FH: gastric cancer 10/24/2016   FH: breast cancer  10/24/2016    Allergies  Allergen Reactions   Cephalexin    Amoxicillin Rash    Past Surgical History:  Procedure Laterality Date   BREAST BIOPSY Bilateral    neg- needle bx/clips   BREAST BIOPSY Right 03/18/2019   affirm bx of calcs medial, coil clip, negative   COLONOSCOPY WITH PROPOFOL N/A 11/28/2016   Procedure: COLONOSCOPY WITH PROPOFOL;  Surgeon: Lucilla Lame, MD;  Location: [place];  Service: Endoscopy;  Laterality: N/A;   COLONOSCOPY WITH PROPOFOL N/A 11/15/2021   Procedure: COLONOSCOPY WITH BIOPSY;  Surgeon: Lucilla Lame, MD;  Location: [place];  Service: Endoscopy;  Laterality: N/A;   ECTOPIC PREGNANCY SURGERY     fractured leg Right 2008   has metal rods   POLYPECTOMY  11/28/2016   Procedure: POLYPECTOMY;  Surgeon: Lucilla Lame, MD;  Location: [place];  Service: Endoscopy;;   POLYPECTOMY N/A 11/15/2021   Procedure: POLYPECTOMY;  Surgeon: Lucilla Lame, MD;  Location: [place];  Service: Endoscopy;  Laterality: N/A;    Social History   Tobacco Use   Smoking status: Never   Smokeless tobacco: Never  Vaping Use   Vaping Use: Never used  Substance Use Topics   Alcohol use: Yes    Comment: Occasionally, 2 glass wine/month   Drug use: No     Medication list has been reviewed and updated.  Current Meds  Medication Sig   Accu-Chek Softclix Lancets lancets Check BS 1 time daily   aspirin 81 MG tablet Take 1 tablet (81 mg total) by mouth daily.   cetirizine (ZYRTEC) 10 MG tablet Take 10 mg by mouth daily.   cholecalciferol (VITAMIN D) 1000 units tablet Take 1,000 Units by mouth daily.   EPINEPHrine 0.3 mg/0.3 mL IJ SOAJ injection Inject 0.3 mg into the muscle as needed for anaphylaxis.   glucose blood (ACCU-CHEK GUIDE) test strip TEST ONCE DAILY   metFORMIN (GLUCOPHAGE) 500 MG tablet TAKE 1 TABLET(500 MG) BY MOUTH TWICE DAILY WITH A MEAL   Multiple Minerals-Vitamins (NUTRA-SUPPORT BONE) CAPS Take 1 capsule by mouth daily.    simvastatin (ZOCOR) 10 MG tablet Take 1 tablet (10 mg total) by mouth at bedtime.   triamcinolone cream (KENALOG) 0.1 % Apply 1 Application topically 2 (two) times daily.   vitamin B-12 (CYANOCOBALAMIN) 500 MCG tablet Take 1 tablet (500 mcg total) by mouth daily.       07/11/2022    8:30 AM 03/06/2022   11:36 AM 02/12/2022    3:29 PM 12/20/2021    8:05 AM  GAD 7 : Generalized Anxiety Score  Nervous, Anxious, on Edge 0 0 0 0  Control/stop worrying 0 0 0 0  Worry too much - different things 0 0 0 0  Trouble relaxing 0 0 0 0  Restless 0 0 0 0  Easily annoyed or irritable 0 0 0 0  Afraid - awful might happen 0 0 0 0  Total GAD 7 Score 0 0 0 0  Anxiety Difficulty Not difficult at all Not difficult at all Not difficult at all Not difficult at all       07/11/2022    8:30 AM 03/06/2022  11:36 AM 02/12/2022    3:29 PM  Depression screen PHQ 2/9  Decreased Interest 0 0 3  Down, Depressed, Hopeless 0 0 0  PHQ - 2 Score 0 0 3  Altered sleeping 0 0 0  Tired, decreased energy 0 0 0  Change in appetite 0 0 0  Feeling bad or failure about yourself  0 0 0  Trouble concentrating 0 0 0  Moving slowly or fidgety/restless 0 0 0  Suicidal thoughts 0 0 0  PHQ-9 Score 0 0 3  Difficult doing work/chores Not difficult at all Not difficult at all Not difficult at all    BP Readings from Last 3 Encounters:  07/11/22 118/70  03/06/22 108/60  02/12/22 110/62    Physical Exam Vitals and nursing note reviewed. Exam conducted with a chaperone present.  Constitutional:      General: She is not in acute distress.    Appearance: She is not diaphoretic.  HENT:     Head: Normocephalic and atraumatic.     Right Ear: Tympanic membrane, ear canal and external ear normal.     Left Ear: Tympanic membrane, ear canal and external ear normal.     Nose: Nose normal. No congestion or rhinorrhea.     Mouth/Throat:     Mouth: Mucous membranes are moist.  Eyes:     General:        Right eye: No discharge.         Left eye: No discharge.     Conjunctiva/sclera: Conjunctivae normal.     Pupils: Pupils are equal, round, and reactive to light.  Neck:     Thyroid: No thyromegaly.     Vascular: No JVD.  Cardiovascular:     Rate and Rhythm: Normal rate and regular rhythm.     Heart sounds: Normal heart sounds, S1 normal and S2 normal. No murmur heard.    No systolic murmur is present.     No diastolic murmur is present.     No friction rub. No gallop. No S3 or S4 sounds.  Pulmonary:     Effort: Pulmonary effort is normal.     Breath sounds: Normal breath sounds. No wheezing, rhonchi or rales.  Abdominal:     General: Bowel sounds are normal.     Palpations: Abdomen is soft. There is no hepatomegaly, splenomegaly or mass.     Tenderness: There is no abdominal tenderness. There is no guarding.  Musculoskeletal:        General: Normal range of motion.     Cervical back: Normal range of motion and neck supple.  Lymphadenopathy:     Cervical: No cervical adenopathy.  Skin:    General: Skin is warm and dry.  Neurological:     Mental Status: She is alert.     Deep Tendon Reflexes: Reflexes are normal and symmetric.     Wt Readings from Last 3 Encounters:  07/11/22 171 lb (77.6 kg)  03/06/22 169 lb (76.7 kg)  02/12/22 168 lb (76.2 kg)    BP 118/70   Pulse 60   Ht _0  (1.727 m)   Wt 171 lb (77.6 kg)   LMP 02/07/1991   SpO2 99%   BMI 26.00 kg/m   Assessment and Plan:  1. Prediabetes Chronic.  Controlled.  Stable.  Last A1c was.  6.2.  Continue metformin 500 mg twice a day.  Reemphasized low-cholesterol low triglyceride dietary guidelines and will check A1c and microalbuminuria. - metFORMIN (GLUCOPHAGE) 500 MG tablet;  TAKE 1 TABLET(500 MG) BY MOUTH TWICE DAILY WITH A MEAL  Dispense: 180 tablet; Refill: 1 - HgB A1c - Microalbumin / creatinine urine ratio  2. Familial hypercholesterolemia Chronic.  Controlled.  Stable.  In addition to dietary guidelines we will continue simvastatin  10 mg 1 nightly.  Will check lipid panel for current level of control and CMP for LFT status. - simvastatin (ZOCOR) 10 MG tablet; Take 1 tablet (10 mg total) by mouth at bedtime.  Dispense: 90 tablet; Refill: 1 - Lipid Panel With LDL/HDL Ratio - Comprehensive Metabolic Panel (CMET)    Otilio Miu, MD

## 2022-07-11 NOTE — Patient Instructions (Signed)

## 2022-07-12 LAB — COMPREHENSIVE METABOLIC PANEL
ALT: 7 IU/L (ref 0–32)
AST: 13 IU/L (ref 0–40)
Albumin/Globulin Ratio: 2 (ref 1.2–2.2)
Albumin: 4.4 g/dL (ref 3.8–4.8)
Alkaline Phosphatase: 76 IU/L (ref 44–121)
BUN/Creatinine Ratio: 10 — ABNORMAL LOW (ref 12–28)
BUN: 8 mg/dL (ref 8–27)
Bilirubin Total: 0.3 mg/dL (ref 0.0–1.2)
CO2: 26 mmol/L (ref 20–29)
Calcium: 9.5 mg/dL (ref 8.7–10.3)
Chloride: 102 mmol/L (ref 96–106)
Creatinine, Ser: 0.82 mg/dL (ref 0.57–1.00)
Globulin, Total: 2.2 g/dL (ref 1.5–4.5)
Glucose: 108 mg/dL — ABNORMAL HIGH (ref 70–99)
Potassium: 4.4 mmol/L (ref 3.5–5.2)
Sodium: 140 mmol/L (ref 134–144)
Total Protein: 6.6 g/dL (ref 6.0–8.5)
eGFR: 76 mL/min/{1.73_m2} (ref >59)

## 2022-07-12 LAB — LIPID PANEL WITH LDL/HDL RATIO
Cholesterol, Total: 178 mg/dL (ref 100–199)
HDL: 61 mg/dL (ref >39)
LDL Chol Calc (NIH): 98 mg/dL (ref 0–99)
LDL/HDL Ratio: 1.6 ratio (ref 0.0–3.2)
Triglycerides: 108 mg/dL (ref 0–149)
VLDL Cholesterol Cal: 19 mg/dL (ref 5–40)

## 2022-07-12 LAB — MICROALBUMIN / CREATININE URINE RATIO
Creatinine, Urine: 128.5 mg/dL
Microalb/Creat Ratio: 3 mg/g creat (ref 0–29)
Microalbumin, Urine: 3.4 ug/mL

## 2022-07-12 LAB — HEMOGLOBIN A1C
Est. average glucose Bld gHb Est-mCnc: 137 mg/dL
Hgb A1c MFr Bld: 6.4 % — ABNORMAL HIGH (ref 4.8–5.6)

## 2022-08-13 DIAGNOSIS — L501 Idiopathic urticaria: Secondary | ICD-10-CM | POA: Diagnosis not present

## 2022-08-29 DIAGNOSIS — H2513 Age-related nuclear cataract, bilateral: Secondary | ICD-10-CM | POA: Diagnosis not present

## 2022-08-29 DIAGNOSIS — H40033 Anatomical narrow angle, bilateral: Secondary | ICD-10-CM | POA: Diagnosis not present

## 2022-08-29 DIAGNOSIS — H40053 Ocular hypertension, bilateral: Secondary | ICD-10-CM | POA: Diagnosis not present

## 2022-09-12 DIAGNOSIS — L501 Idiopathic urticaria: Secondary | ICD-10-CM | POA: Diagnosis not present

## 2022-10-08 ENCOUNTER — Telehealth: Payer: Self-pay | Admitting: Family Medicine

## 2022-10-08 NOTE — Telephone Encounter (Signed)
Medication Refill - Medication: [phone]  Has the patient contacted their pharmacy? No. Melissa with [facility] is calling requesting refill for pt medication.   Preferred Pharmacy (with phone number or street name):  [id number] - [place], [address] AT [place] Phone: [phone]  Fax: [phone]     Has the patient been seen for an appointment in the last year OR does the patient have an upcoming appointment? Yes.    Agent: Please be advised that RX refills may take up to 3 business days. We ask that you follow-up with your pharmacy.

## 2022-10-08 NOTE — Telephone Encounter (Signed)
[facility] for clarification of what meds needed refilled. Spoke to [place]. States pt in need of Metformin and Simvastatin refills. Request sent to [facility] refill pool.

## 2022-11-01 ENCOUNTER — Telehealth: Payer: Self-pay | Admitting: Family Medicine

## 2022-11-01 NOTE — Telephone Encounter (Signed)
Contacted Oletta Lamas Kilker to schedule their annual wellness visit. Appointment made for 12/04/2022.  Verlee Rossetti; Care Guide Ambulatory Clinical Support [place] [facility] Direct Dial: [phone]

## 2022-12-04 ENCOUNTER — Ambulatory Visit: Payer: No Typology Code available for payment source

## 2022-12-04 VITALS — Ht 68.0 in | Wt 171.0 lb

## 2022-12-04 DIAGNOSIS — Z0000 Encounter for general adult medical examination without abnormal findings: Secondary | ICD-10-CM

## 2022-12-04 NOTE — Patient Instructions (Signed)
Wendy Tucker , Thank you for taking time to come for your Medicare Wellness Visit. I appreciate your ongoing commitment to your health goals. Please review the following plan we discussed and let me know if I can assist you in the future.   These are the goals we discussed:  Goals      DIET - EAT MORE FRUITS AND VEGETABLES     Patient Stated     Patient states she would like to maintain health and current A1c of less than 6.5%        This is a list of the screening recommended for you and due dates:  Health Maintenance  Topic Date Due   COVID-19 Vaccine (6 - 2023-24 season) 03/08/2022   Hepatitis C Screening  12/21/2022*   Complete foot exam   12/21/2022   Hemoglobin A1C  01/09/2023   Flu Shot  02/06/2023   Eye exam for diabetics  02/15/2023   Mammogram  05/10/2023   Yearly kidney function blood test for diabetes  07/12/2023   Yearly kidney health urinalysis for diabetes  07/12/2023   Medicare Annual Wellness Visit  12/04/2023   DTaP/Tdap/Td vaccine (2 - Td or Tdap) 10/25/2026   Colon Cancer Screening  11/16/2026   Pneumonia Vaccine  Completed   DEXA scan (bone density measurement)  Completed   Zoster (Shingles) Vaccine  Completed   HPV Vaccine  Aged Out  *Topic was postponed. The date shown is not the original due date.    Advanced directives: no  Conditions/risks identified: none  Next appointment: Follow up in one year for your annual wellness visit 12/10/23 @ 2:30 pm by phone   Preventive Care 65 Years and Older, Female Preventive care refers to lifestyle choices and visits with your health care provider that can promote health and wellness. What does preventive care include? A yearly physical exam. This is also called an annual well check. Dental exams once or twice a year. Routine eye exams. Ask your health care provider how often you should have your eyes checked. Personal lifestyle choices, including: Daily care of your teeth and gums. Regular physical  activity. Eating a healthy diet. Avoiding tobacco and drug use. Limiting alcohol use. Practicing safe sex. Taking low-dose aspirin every day. Taking vitamin and mineral supplements as recommended by your health care provider. What happens during an annual well check? The services and screenings done by your health care provider during your annual well check will depend on your age, overall health, lifestyle risk factors, and family history of disease. Counseling  Your health care provider may ask you questions about your: Alcohol use. Tobacco use. Drug use. Emotional well-being. Home and relationship well-being. Sexual activity. Eating habits. History of falls. Memory and ability to understand (cognition). Work and work Astronomer. Reproductive health. Screening  You may have the following tests or measurements: Height, weight, and BMI. Blood pressure. Lipid and cholesterol levels. These may be checked every 5 years, or more frequently if you are over 23 years old. Skin check. Lung cancer screening. You may have this screening every year starting at age 69 if you have a 30-pack-year history of smoking and currently smoke or have quit within the past 15 years. Fecal occult blood test (FOBT) of the stool. You may have this test every year starting at age 75. Flexible sigmoidoscopy or colonoscopy. You may have a sigmoidoscopy every 5 years or a colonoscopy every 10 years starting at age 87. Hepatitis C blood test. Hepatitis B blood test. Sexually  transmitted disease (STD) testing. Diabetes screening. This is done by checking your blood sugar (glucose) after you have not eaten for a while (fasting). You may have this done every 1-3 years. Bone density scan. This is done to screen for osteoporosis. You may have this done starting at age 35. Mammogram. This may be done every 1-2 years. Talk to your health care provider about how often you should have regular mammograms. Talk with your  health care provider about your test results, treatment options, and if necessary, the need for more tests. Vaccines  Your health care provider may recommend certain vaccines, such as: Influenza vaccine. This is recommended every year. Tetanus, diphtheria, and acellular pertussis (Tdap, Td) vaccine. You may need a Td booster every 10 years. Zoster vaccine. You may need this after age 30. Pneumococcal 13-valent conjugate (PCV13) vaccine. One dose is recommended after age 28. Pneumococcal polysaccharide (PPSV23) vaccine. One dose is recommended after age 93. Talk to your health care provider about which screenings and vaccines you need and how often you need them. This information is not intended to replace advice given to you by your health care provider. Make sure you discuss any questions you have with your health care provider. Document Released: 07/21/2015 Document Revised: 03/13/2016 Document Reviewed: 04/25/2015 Elsevier Interactive Patient Education  2017 [employer].  Fall Prevention in the Home Falls can cause injuries. They can happen to people of all ages. There are many things you can do to make your home safe and to help prevent falls. What can I do on the outside of my home? Regularly fix the edges of walkways and driveways and fix any cracks. Remove anything that might make you trip as you walk through a door, such as a raised step or threshold. Trim any bushes or trees on the path to your home. Use bright outdoor lighting. Clear any walking paths of anything that might make someone trip, such as rocks or tools. Regularly check to see if handrails are loose or broken. Make sure that both sides of any steps have handrails. Any raised decks and porches should have guardrails on the edges. Have any leaves, snow, or ice cleared regularly. Use sand or salt on walking paths during winter. Clean up any spills in your garage right away. This includes oil or grease spills. What can I  do in the bathroom? Use night lights. Install grab bars by the toilet and in the tub and shower. Do not use towel bars as grab bars. Use non-skid mats or decals in the tub or shower. If you need to sit down in the shower, use a plastic, non-slip stool. Keep the floor dry. Clean up any water that spills on the floor as soon as it happens. Remove soap buildup in the tub or shower regularly. Attach bath mats securely with double-sided non-slip rug tape. Do not have throw rugs and other things on the floor that can make you trip. What can I do in the bedroom? Use night lights. Make sure that you have a light by your bed that is easy to reach. Do not use any sheets or blankets that are too big for your bed. They should not hang down onto the floor. Have a firm chair that has side arms. You can use this for support while you get dressed. Do not have throw rugs and other things on the floor that can make you trip. What can I do in the kitchen? Clean up any spills right away. Avoid  walking on wet floors. Keep items that you use a lot in easy-to-reach places. If you need to reach something above you, use a strong step stool that has a grab bar. Keep electrical cords out of the way. Do not use floor polish or wax that makes floors slippery. If you must use wax, use non-skid floor wax. Do not have throw rugs and other things on the floor that can make you trip. What can I do with my stairs? Do not leave any items on the stairs. Make sure that there are handrails on both sides of the stairs and use them. Fix handrails that are broken or loose. Make sure that handrails are as long as the stairways. Check any carpeting to make sure that it is firmly attached to the stairs. Fix any carpet that is loose or worn. Avoid having throw rugs at the top or bottom of the stairs. If you do have throw rugs, attach them to the floor with carpet tape. Make sure that you have a light switch at the top of the stairs  and the bottom of the stairs. If you do not have them, ask someone to add them for you. What else can I do to help prevent falls? Wear shoes that: Do not have high heels. Have rubber bottoms. Are comfortable and fit you well. Are closed at the toe. Do not wear sandals. If you use a stepladder: Make sure that it is fully opened. Do not climb a closed stepladder. Make sure that both sides of the stepladder are locked into place. Ask someone to hold it for you, if possible. Clearly mark and make sure that you can see: Any grab bars or handrails. First and last steps. Where the edge of each step is. Use tools that help you move around (mobility aids) if they are needed. These include: Canes. Walkers. Scooters. Crutches. Turn on the lights when you go into a dark area. Replace any light bulbs as soon as they burn out. Set up your furniture so you have a clear path. Avoid moving your furniture around. If any of your floors are uneven, fix them. If there are any pets around you, be aware of where they are. Review your medicines with your doctor. Some medicines can make you feel dizzy. This can increase your chance of falling. Ask your doctor what other things that you can do to help prevent falls. This information is not intended to replace advice given to you by your health care provider. Make sure you discuss any questions you have with your health care provider. Document Released: 04/20/2009 Document Revised: 11/30/2015 Document Reviewed: 07/29/2014 Elsevier Interactive Patient Education  2017 [employer].

## 2022-12-04 NOTE — Progress Notes (Signed)
I connected with  Wendy Tucker on 12/04/22 by a audio enabled telemedicine application and verified that I am speaking with the correct person using two identifiers.  Patient Location: Home  Provider Location: Office/Clinic  I discussed the limitations of evaluation and management by telemedicine. The patient expressed understanding and agreed to proceed.  Subjective:   Wendy Tucker is a 72 y.o. female who presents for Medicare Annual (Subsequent) preventive examination.  Review of Systems     Cardiac Risk Factors include: advanced age (>19men, >33 women);dyslipidemia     Objective:    There were no vitals filed for this visit. There is no height or weight on file to calculate BMI.     12/04/2022    3:01 PM 11/26/2021    1:51 PM 11/15/2021    7:08 AM 11/22/2020    2:26 PM 11/17/2019    3:29 PM 01/29/2017    9:36 AM 01/02/2017    8:47 AM  Advanced Directives  Does Patient Have a Medical Advance Directive? No Yes Yes Yes Yes Yes Yes  Type of Furniture conservator/restorer;Living will Healthcare Power of [place];Living will Healthcare Power of [place];Living will Healthcare Power of [place];Living will    Does patient want to make changes to medical advance directive?   No - Patient declined      Copy of Healthcare Power of Attorney in Chart?  No - copy requested Yes - validated most recent copy scanned in chart (See row information) No - copy requested No - copy requested    Would patient like information on creating a medical advance directive? No - Patient declined          Current Medications (verified) Outpatient Encounter Medications as of 12/04/2022  Medication Sig   Accu-Chek Softclix Lancets lancets Check BS 1 time daily   aspirin 81 MG tablet Take 1 tablet (81 mg total) by mouth daily.   cholecalciferol (VITAMIN D) 1000 units tablet Take 1,000 Units by mouth daily.   EPINEPHrine 0.3 mg/0.3 mL IJ SOAJ injection Inject 0.3 mg into the muscle as  needed for anaphylaxis.   glucose blood (ACCU-CHEK GUIDE) test strip TEST ONCE DAILY   metFORMIN (GLUCOPHAGE) 500 MG tablet TAKE 1 TABLET(500 MG) BY MOUTH TWICE DAILY WITH A MEAL   Multiple Minerals-Vitamins (NUTRA-SUPPORT BONE) CAPS Take 1 capsule by mouth daily.   simvastatin (ZOCOR) 10 MG tablet Take 1 tablet (10 mg total) by mouth at bedtime.   triamcinolone cream (KENALOG) 0.1 % Apply 1 Application topically 2 (two) times daily.   vitamin B-12 (CYANOCOBALAMIN) 500 MCG tablet Take 1 tablet (500 mcg total) by mouth daily.   cetirizine (ZYRTEC) 10 MG tablet Take 10 mg by mouth daily. (Patient not taking: Reported on 12/04/2022)   No facility-administered encounter medications on file as of 12/04/2022.    Allergies (verified) Cephalexin, Cortisone, and Amoxicillin   History: Past Medical History:  Diagnosis Date   Diabetes mellitus without complication (HCC)    Eye infection, right    Hyperlipidemia    Vertigo    x1, over 20 yrs ago   Past Surgical History:  Procedure Laterality Date   BREAST BIOPSY Bilateral    neg- needle bx/clips   BREAST BIOPSY Right 03/18/2019   affirm bx of calcs medial, coil clip, negative   COLONOSCOPY WITH PROPOFOL N/A 11/28/2016   Procedure: COLONOSCOPY WITH PROPOFOL;  Surgeon: Midge Minium, MD;  Location: [facility];  Service: Endoscopy;  Laterality: N/A;   COLONOSCOPY  WITH PROPOFOL N/A 11/15/2021   Procedure: COLONOSCOPY WITH BIOPSY;  Surgeon: Midge Minium, MD;  Location: [facility];  Service: Endoscopy;  Laterality: N/A;   ECTOPIC PREGNANCY SURGERY     fractured leg Right 2008   has metal rods   POLYPECTOMY  11/28/2016   Procedure: POLYPECTOMY;  Surgeon: Midge Minium, MD;  Location: [facility];  Service: Endoscopy;;   POLYPECTOMY N/A 11/15/2021   Procedure: POLYPECTOMY;  Surgeon: Midge Minium, MD;  Location: [facility];  Service: Endoscopy;  Laterality: N/A;   Family History  Problem Relation Age of Onset    Hypertension Mother    Stomach cancer Father    Breast cancer Sister 53   Bone cancer Sister    Social History   Socioeconomic History   Marital status: Single    Spouse name: Not on file   Number of children: 0   Years of education: Not on file   Highest education level: Not on file  Occupational History   Not on file  Tobacco Use   Smoking status: Never   Smokeless tobacco: Never  Vaping Use   Vaping Use: Never used  Substance and Sexual Activity   Alcohol use: Yes    Comment: Occasionally, 2 glass wine/month   Drug use: No   Sexual activity: Yes    Birth control/protection: Post-menopausal  Other Topics Concern   Not on file  Social History Narrative   Pt lives alone   Social Determinants of Health   Financial Resource Strain: Low Risk  (12/04/2022)   Overall Financial Resource Strain (CARDIA)    Difficulty of Paying Living Expenses: Not hard at all  Food Insecurity: No Food Insecurity (12/04/2022)   Hunger Vital Sign    Worried About Running Out of Food in the Last Year: Never true    Ran Out of Food in the Last Year: Never true  Transportation Needs: No Transportation Needs (12/04/2022)   PRAPARE - Administrator, Civil Service (Medical): No    Lack of Transportation (Non-Medical): No  Physical Activity: Sufficiently Active (12/04/2022)   Exercise Vital Sign    Days of Exercise per Week: 7 days    Minutes of Exercise per Session: 30 min  Stress: No Stress Concern Present (12/04/2022)   [employer] of Occupational Health - Occupational Stress Questionnaire    Feeling of Stress : Not at all  Social Connections: Moderately Isolated (12/04/2022)   Social Connection and Isolation Panel [NHANES]    Frequency of Communication with Friends and Family: More than three times a week    Frequency of Social Gatherings with Friends and Family: Three times a week    Attends Religious Services: More than 4 times per year    Active Member of Clubs or  Organizations: No    Attends Banker Meetings: Never    Marital Status: Divorced    Tobacco Counseling Counseling given: Not Answered   Clinical Intake:  Pre-visit preparation completed: Yes  Pain : No/denies pain     Nutritional Risks: None Diabetes: No  How often do you need to have someone help you when you read instructions, pamphlets, or other written materials from your doctor or pharmacy?: 1 - Never  Diabetic?no per patient  Interpreter Needed?: No  Information entered by :: Kennedy Bucker, LPN   Activities of Daily Living    12/04/2022    3:02 PM  In your present state of health, do you have any difficulty performing the following  activities:  Hearing? 0  Vision? 0  Difficulty concentrating or making decisions? 0  Walking or climbing stairs? 0  Dressing or bathing? 0  Doing errands, shopping? 0  Preparing Food and eating ? N  Using the Toilet? N  In the past six months, have you accidently leaked urine? N  Do you have problems with loss of bowel control? N  Managing your Medications? N  Managing your Finances? N  Housekeeping or managing your Housekeeping? N    Patient Care Team: Duanne Limerick, MD as PCP - General (Family Medicine) Hildred Laser, MD as Referring Physician (Obstetrics and Gynecology)  Indicate any recent Medical Services you may have received from other than [facility] providers in the past year (date may be approximate).     Assessment:   This is a routine wellness examination for [place].  Hearing/Vision screen Hearing Screening - Comments:: No aids Vision Screening - Comments:: Wears glasses= Dr.Brasington  Dietary issues and exercise activities discussed: Current Exercise Habits: Home exercise routine, Type of exercise: walking;Other - see comments (farm/yard work), Time (Minutes): 30, Frequency (Times/Week): 7, Weekly Exercise (Minutes/Week): 210, Intensity: Mild   Goals Addressed             This Visit's  Progress    DIET - EAT MORE FRUITS AND VEGETABLES         Depression Screen    12/04/2022    3:00 PM 07/11/2022    8:30 AM 03/06/2022   11:36 AM 02/12/2022    3:29 PM 12/20/2021    8:04 AM 11/26/2021    1:50 PM 01/30/2021    8:34 AM  PHQ 2/9 Scores  PHQ - 2 Score 0 0 0 3 0 0 0  PHQ- 9 Score 0 0 0 3 1  0    Fall Risk    12/04/2022    3:01 PM 07/11/2022    8:30 AM 03/06/2022   11:35 AM 02/12/2022    3:29 PM 12/20/2021    8:05 AM  Fall Risk   Falls in the past year? 0 0 0 0 0  Number falls in past yr: 0 0 0 0 0  Injury with Fall? 0 0 0 0 0  Risk for fall due to : No Fall Risks No Fall Risks No Fall Risks No Fall Risks No Fall Risks  Follow up Falls prevention discussed;Falls evaluation completed Falls evaluation completed Falls evaluation completed Falls evaluation completed Falls evaluation completed    FALL RISK PREVENTION PERTAINING TO THE HOME:  Any stairs in or around the home? No  If so, are there any without handrails? No  Home free of loose throw rugs in walkways, pet beds, electrical cords, etc? Yes  Adequate lighting in your home to reduce risk of falls? Yes   ASSISTIVE DEVICES UTILIZED TO PREVENT FALLS:  Life alert? No  Use of a cane, walker or w/c? No  Grab bars in the bathroom? Yes  Shower chair or bench in shower? Yes  Elevated toilet seat or a handicapped toilet? Yes    Cognitive Function:        12/04/2022    3:05 PM 11/17/2019    3:32 PM  6CIT Screen  What Year? 0 points 0 points  What month? 0 points 0 points  What time? 0 points 0 points  Count back from 20 0 points 0 points  Months in reverse 0 points 0 points  Repeat phrase 0 points 0 points  Total Score 0 points 0 points  Immunizations Immunization History  Administered Date(s) Administered   Fluad Quad(high Dose 65+) 03/25/2019, 03/30/2020   Influenza-Unspecified 04/08/2021   Moderna Covid-19 Vaccine Bivalent Booster 25yrs & up 05/07/2021   Moderna Sars-Covid-2 Vaccination 07/11/2019,  08/08/2019, 05/04/2020, 10/11/2020   Pneumococcal Conjugate-13 10/24/2016   Pneumococcal Polysaccharide-23 05/27/2019   Tdap 10/24/2016   Zoster Recombinat (Shingrix) 09/12/2020, 03/14/2021    TDAP status: Up to date  Flu Vaccine status: Up to date- per patient  Pneumococcal vaccine status: Up to date  Covid-19 vaccine status: Completed vaccines  Qualifies for Shingles Vaccine? Yes   Zostavax completed No   Shingrix Completed?: Yes  Screening Tests Health Maintenance  Topic Date Due   COVID-19 Vaccine (6 - 2023-24 season) 03/08/2022   Hepatitis C Screening  12/21/2022 (Originally 06/30/1969)   FOOT EXAM  12/21/2022   HEMOGLOBIN A1C  01/09/2023   INFLUENZA VACCINE  02/06/2023   OPHTHALMOLOGY EXAM  02/15/2023   MAMMOGRAM  05/10/2023   Diabetic kidney evaluation - eGFR measurement  07/12/2023   Diabetic kidney evaluation - Urine ACR  07/12/2023   Medicare Annual Wellness (AWV)  12/04/2023   DTaP/Tdap/Td (2 - Td or Tdap) 10/25/2026   Colonoscopy  11/16/2026   Pneumonia Vaccine 33+ Years old  Completed   DEXA SCAN  Completed   Zoster Vaccines- Shingrix  Completed   HPV VACCINES  Aged Out    Health Maintenance  Health Maintenance Due  Topic Date Due   COVID-19 Vaccine (6 - 2023-24 season) 03/08/2022    Colorectal cancer screening: Type of screening: Colonoscopy. Completed 11/15/21. Repeat every 5 years  Mammogram status: Completed 05/09/22. Repeat every year  Bone Density status: Completed 01/18/21. Results reflect: Bone density results: OSTEOPENIA. Repeat every 5 years.  Lung Cancer Screening: (Low Dose CT Chest recommended if Age 59-80 years, 30 pack-year currently smoking OR have quit w/in 15years.) does not qualify.    Additional Screening:  Hepatitis C Screening: does qualify; Completed no  Vision Screening: Recommended annual ophthalmology exams for early detection of glaucoma and other disorders of the eye. Is the patient up to date with their annual eye  exam?  Yes  Who is the provider or what is the name of the office in which the patient attends annual eye exams? Dr.Brasington If pt is not established with a provider, would they like to be referred to a provider to establish care? No .   Dental Screening: Recommended annual dental exams for proper oral hygiene  Community Resource Referral / Chronic Care Management: CRR required this visit?  No   CCM required this visit?  No      Plan:     I have personally reviewed and noted the following in the patient's chart:   Medical and social history Use of alcohol, tobacco or illicit drugs  Current medications and supplements including opioid prescriptions. Patient is not currently taking opioid prescriptions. Functional ability and status Nutritional status Physical activity Advanced directives List of other physicians Hospitalizations, surgeries, and ER visits in previous 12 months Vitals Screenings to include cognitive, depression, and falls Referrals and appointments  In addition, I have reviewed and discussed with patient certain preventive protocols, quality metrics, and best practice recommendations. A written personalized care plan for preventive services as well as general preventive health recommendations were provided to patient.     Hal Hope, LPN   [id number]   Nurse Notes: none

## 2022-12-13 DIAGNOSIS — E785 Hyperlipidemia, unspecified: Secondary | ICD-10-CM | POA: Diagnosis not present

## 2022-12-13 DIAGNOSIS — N182 Chronic kidney disease, stage 2 (mild): Secondary | ICD-10-CM | POA: Diagnosis not present

## 2022-12-13 DIAGNOSIS — Z6827 Body mass index (BMI) 27.0-27.9, adult: Secondary | ICD-10-CM | POA: Diagnosis not present

## 2022-12-13 DIAGNOSIS — Z008 Encounter for other general examination: Secondary | ICD-10-CM | POA: Diagnosis not present

## 2022-12-13 DIAGNOSIS — E663 Overweight: Secondary | ICD-10-CM | POA: Diagnosis not present

## 2022-12-13 DIAGNOSIS — E1136 Type 2 diabetes mellitus with diabetic cataract: Secondary | ICD-10-CM | POA: Diagnosis not present

## 2022-12-13 DIAGNOSIS — E1169 Type 2 diabetes mellitus with other specified complication: Secondary | ICD-10-CM | POA: Diagnosis not present

## 2022-12-13 DIAGNOSIS — E1122 Type 2 diabetes mellitus with diabetic chronic kidney disease: Secondary | ICD-10-CM | POA: Diagnosis not present

## 2023-01-10 ENCOUNTER — Ambulatory Visit (INDEPENDENT_AMBULATORY_CARE_PROVIDER_SITE_OTHER): Payer: No Typology Code available for payment source | Admitting: Family Medicine

## 2023-01-10 ENCOUNTER — Encounter: Payer: Self-pay | Admitting: Family Medicine

## 2023-01-10 VITALS — BP 126/70 | HR 100 | Ht 68.0 in | Wt 165.0 lb

## 2023-01-10 DIAGNOSIS — E7801 Familial hypercholesterolemia: Secondary | ICD-10-CM

## 2023-01-10 DIAGNOSIS — R7303 Prediabetes: Secondary | ICD-10-CM | POA: Diagnosis not present

## 2023-01-10 MED ORDER — SIMVASTATIN 10 MG PO TABS: 10.0000 mg | ORAL_TABLET | Freq: Every day | ORAL | 1 refills | Status: DC

## 2023-01-10 MED ORDER — METFORMIN HCL 500 MG PO TABS: ORAL_TABLET | ORAL | 1 refills | Status: DC

## 2023-01-10 NOTE — Progress Notes (Signed)
Date:  01/10/2023   Name:  Wendy Tucker   DOB:  1950-11-01   MRN:  [id number]   Chief Complaint: Prediabetes and Hyperlipidemia  Hyperlipidemia The current episode started more than 1 year ago. The problem is controlled. Recent lipid tests were reviewed and are normal. Exacerbating diseases include diabetes. She has no history of hypothyroidism, liver disease, obesity or nephrotic syndrome. There are no known factors aggravating her hyperlipidemia. Pertinent negatives include no chest pain, focal weakness, myalgias or shortness of breath. Current antihyperlipidemic treatment includes statins. The current treatment provides moderate improvement of lipids. There are no compliance problems.   Diabetes She presents for her follow-up diabetic visit. Diabetes type: prediabetes. Her disease course has been stable. There are no hypoglycemic associated symptoms. There are no diabetic associated symptoms. Pertinent negatives for diabetes include no blurred vision, no chest pain, no foot paresthesias, no polydipsia, no polyuria and no weakness. There are no hypoglycemic complications. Symptoms are stable. There are no diabetic complications. Risk factors for coronary artery disease include dyslipidemia and hypertension. Current diabetic treatment includes oral agent (monotherapy). She is following a generally healthy diet. Meal planning includes avoidance of concentrated sweets and carbohydrate counting.    Lab Results  Component Value Date   NA 140 07/11/2022   K 4.4 07/11/2022   CO2 26 07/11/2022   GLUCOSE 108 (H) 07/11/2022   BUN 8 07/11/2022   CREATININE 0.82 07/11/2022   CALCIUM 9.5 07/11/2022   EGFR 76 07/11/2022   GFRNONAA 59 (L) 05/25/2020   Lab Results  Component Value Date   CHOL 178 07/11/2022   HDL 61 07/11/2022   LDLCALC 98 07/11/2022   TRIG 108 07/11/2022   CHOLHDL 3.3 05/27/2019   Lab Results  Component Value Date   TSH 1.100 05/27/2019   Lab Results  Component Value  Date   HGBA1C 6.4 (H) 07/11/2022   Lab Results  Component Value Date   WBC 8.4 02/07/2016   HGB 12.9 02/07/2016   HCT 39.0 02/07/2016   MCV 91 02/07/2016   PLT 206 02/07/2016   Lab Results  Component Value Date   ALT 7 07/11/2022   AST 13 07/11/2022   ALKPHOS 76 07/11/2022   BILITOT 0.3 07/11/2022   Lab Results  Component Value Date   VD25OH 31.9 10/24/2016     Review of Systems  Constitutional:  Negative for chills and unexpected weight change.  HENT:  Negative for congestion and trouble swallowing.   Eyes:  Negative for blurred vision and visual disturbance.  Respiratory:  Negative for chest tightness, shortness of breath and wheezing.   Cardiovascular:  Negative for chest pain and palpitations.  Gastrointestinal:  Negative for abdominal distention, abdominal pain and blood in stool.  Endocrine: Negative for polydipsia and polyuria.  Genitourinary:  Negative for difficulty urinating and vaginal bleeding.  Musculoskeletal:  Negative for myalgias.  Skin:  Negative for color change.  Neurological:  Negative for focal weakness and weakness.  Hematological:  Negative for adenopathy. Does not bruise/bleed easily.    Patient Active Problem List   Diagnosis Date Noted   Personal history of colonic polyps    Polyp of colon    Special screening for malignant neoplasms, colon    Polyp of sigmoid colon    Benign neoplasm of descending colon    Hyperlipidemia 11/21/2016   Vitamin D deficiency 10/24/2016   Osteopenia 10/24/2016   Diabetes mellitus type 2, controlled, without complications (HCC) 10/24/2016   B12 deficiency 10/24/2016  Obesity (BMI 30.0-34.9) 10/24/2016   FH: Alzheimer's disease 10/24/2016   FH: gastric cancer 10/24/2016   FH: breast cancer 10/24/2016    Allergies  Allergen Reactions   Cephalexin    Cortisone    Amoxicillin Rash    Past Surgical History:  Procedure Laterality Date   BREAST BIOPSY Bilateral    neg- needle bx/clips   BREAST BIOPSY  Right 03/18/2019   affirm bx of calcs medial, coil clip, negative   COLONOSCOPY WITH PROPOFOL N/A 11/28/2016   Procedure: COLONOSCOPY WITH PROPOFOL;  Surgeon: Midge Minium, MD;  Location: [facility];  Service: Endoscopy;  Laterality: N/A;   COLONOSCOPY WITH PROPOFOL N/A 11/15/2021   Procedure: COLONOSCOPY WITH BIOPSY;  Surgeon: Midge Minium, MD;  Location: [facility];  Service: Endoscopy;  Laterality: N/A;   ECTOPIC PREGNANCY SURGERY     fractured leg Right 2008   has metal rods   POLYPECTOMY  11/28/2016   Procedure: POLYPECTOMY;  Surgeon: Midge Minium, MD;  Location: [facility];  Service: Endoscopy;;   POLYPECTOMY N/A 11/15/2021   Procedure: POLYPECTOMY;  Surgeon: Midge Minium, MD;  Location: [facility];  Service: Endoscopy;  Laterality: N/A;    Social History   Tobacco Use   Smoking status: Never   Smokeless tobacco: Never  Vaping Use   Vaping Use: Never used  Substance Use Topics   Alcohol use: Yes    Comment: Occasionally, 2 glass wine/month   Drug use: No     Medication list has been reviewed and updated.  Current Meds  Medication Sig   Accu-Chek Softclix Lancets lancets Check BS 1 time daily   aspirin 81 MG tablet Take 1 tablet (81 mg total) by mouth daily.   cholecalciferol (VITAMIN D) 1000 units tablet Take 1,000 Units by mouth daily.   EPINEPHrine 0.3 mg/0.3 mL IJ SOAJ injection Inject 0.3 mg into the muscle as needed for anaphylaxis.   glucose blood (ACCU-CHEK GUIDE) test strip TEST ONCE DAILY   metFORMIN (GLUCOPHAGE) 500 MG tablet TAKE 1 TABLET(500 MG) BY MOUTH TWICE DAILY WITH A MEAL   Multiple Minerals-Vitamins (NUTRA-SUPPORT BONE) CAPS Take 1 capsule by mouth daily.   simvastatin (ZOCOR) 10 MG tablet Take 1 tablet (10 mg total) by mouth at bedtime.   triamcinolone cream (KENALOG) 0.1 % Apply 1 Application topically 2 (two) times daily.   vitamin B-12 (CYANOCOBALAMIN) 500 MCG tablet Take 1 tablet (500 mcg total) by mouth daily.        01/10/2023    8:54 AM 07/11/2022    8:30 AM 03/06/2022   11:36 AM 02/12/2022    3:29 PM  GAD 7 : Generalized Anxiety Score  Nervous, Anxious, on Edge 0 0 0 0  Control/stop worrying 0 0 0 0  Worry too much - different things 0 0 0 0  Trouble relaxing 0 0 0 0  Restless 0 0 0 0  Easily annoyed or irritable 0 0 0 0  Afraid - awful might happen 0 0 0 0  Total GAD 7 Score 0 0 0 0  Anxiety Difficulty Not difficult at all Not difficult at all Not difficult at all Not difficult at all       01/10/2023    8:54 AM 12/04/2022    3:00 PM 07/11/2022    8:30 AM  Depression screen PHQ 2/9  Decreased Interest 0 0 0  Down, Depressed, Hopeless 0 0 0  PHQ - 2 Score 0 0 0  Altered sleeping 0 0 0  Tired, decreased energy 0 0 0  Change in appetite 0 0 0  Feeling bad or failure about yourself  0 0 0  Trouble concentrating 0 0 0  Moving slowly or fidgety/restless 0 0 0  Suicidal thoughts 0 0 0  PHQ-9 Score 0 0 0  Difficult doing work/chores Not difficult at all Not difficult at all Not difficult at all    BP Readings from Last 3 Encounters:  01/10/23 126/70  07/11/22 118/70  03/06/22 108/60    Physical Exam Vitals and nursing note reviewed. Exam conducted with a chaperone present.  Constitutional:      General: She is not in acute distress.    Appearance: She is not diaphoretic.  HENT:     Head: Normocephalic and atraumatic.     Right Ear: Tympanic membrane, ear canal and external ear normal.     Left Ear: Tympanic membrane, ear canal and external ear normal.     Nose: Nose normal.     Mouth/Throat:     Mouth: Mucous membranes are moist.  Eyes:     General:        Right eye: No discharge.        Left eye: No discharge.     Conjunctiva/sclera: Conjunctivae normal.     Pupils: Pupils are equal, round, and reactive to light.  Neck:     Thyroid: No thyromegaly.     Vascular: No JVD.  Cardiovascular:     Rate and Rhythm: Normal rate and regular rhythm.     Heart sounds: Normal heart  sounds. No murmur heard.    No friction rub. No gallop.  Pulmonary:     Effort: Pulmonary effort is normal.     Breath sounds: Normal breath sounds. No wheezing, rhonchi or rales.  Abdominal:     General: Bowel sounds are normal.     Palpations: Abdomen is soft. There is no mass.     Tenderness: There is no abdominal tenderness. There is no guarding.  Musculoskeletal:        General: Normal range of motion.     Cervical back: Normal range of motion and neck supple.  Lymphadenopathy:     Cervical: No cervical adenopathy.  Skin:    General: Skin is warm and dry.  Neurological:     Mental Status: She is alert.     Wt Readings from Last 3 Encounters:  01/10/23 165 lb (74.8 kg)  12/04/22 171 lb (77.6 kg)  07/11/22 171 lb (77.6 kg)    BP 126/70   Pulse 100   Ht 5\' 8"  (1.727 m)   Wt 165 lb (74.8 kg)   LMP 02/07/1991   SpO2 98%   BMI 25.09 kg/m   Assessment and Plan:  1. Prediabetes Chronic.  Controlled.  Stable.  Together with dietary discretion to eliminate concentrated sweets and limit carbohydrates patient takes metformin 500 mg twice a day.  Will check A1c for current level of control and renal function panel for electrolytes and GFR.  Will recheck prediabetes in 6 months. - metFORMIN (GLUCOPHAGE) 500 MG tablet; TAKE 1 TABLET(500 MG) BY MOUTH TWICE DAILY WITH A MEAL  Dispense: 180 tablet; Refill: 1 - HgB A1c - Renal Function Panel  2. Familial hypercholesterolemia Chronic.  Controlled.  Stable.  Continue simvastatin 10 mg once 1 nightly.  Will recheck in 6 months. - simvastatin (ZOCOR) 10 MG tablet; Take 1 tablet (10 mg total) by mouth at bedtime.  Dispense: 90 tablet; Refill: 1  Elizabeth Sauer, MD

## 2023-02-25 DIAGNOSIS — E119 Type 2 diabetes mellitus without complications: Secondary | ICD-10-CM | POA: Diagnosis not present

## 2023-02-25 DIAGNOSIS — H40053 Ocular hypertension, bilateral: Secondary | ICD-10-CM | POA: Diagnosis not present

## 2023-02-25 DIAGNOSIS — H40033 Anatomical narrow angle, bilateral: Secondary | ICD-10-CM | POA: Diagnosis not present

## 2023-02-25 DIAGNOSIS — H2513 Age-related nuclear cataract, bilateral: Secondary | ICD-10-CM | POA: Diagnosis not present

## 2023-02-25 LAB — HM DIABETES EYE EXAM

## 2023-04-28 ENCOUNTER — Other Ambulatory Visit: Payer: Self-pay | Admitting: Family Medicine

## 2023-04-28 DIAGNOSIS — Z1231 Encounter for screening mammogram for malignant neoplasm of breast: Secondary | ICD-10-CM

## 2023-05-15 ENCOUNTER — Ambulatory Visit
Admission: RE | Admit: 2023-05-15 | Discharge: 2023-05-15 | Disposition: A | Discharge status: Home / Self Care | Payer: No Typology Code available for payment source | Source: Ambulatory Visit | Attending: Family Medicine | Admitting: Family Medicine

## 2023-05-15 DIAGNOSIS — Z1231 Encounter for screening mammogram for malignant neoplasm of breast: Secondary | ICD-10-CM | POA: Diagnosis not present

## 2023-06-19 ENCOUNTER — Ambulatory Visit: Payer: No Typology Code available for payment source | Admitting: Family Medicine

## 2023-07-14 ENCOUNTER — Ambulatory Visit: Payer: No Typology Code available for payment source | Admitting: Family Medicine

## 2023-07-17 ENCOUNTER — Ambulatory Visit: Payer: No Typology Code available for payment source | Admitting: Family Medicine

## 2023-07-17 ENCOUNTER — Encounter: Payer: Self-pay | Admitting: Family Medicine

## 2023-07-17 ENCOUNTER — Ambulatory Visit (INDEPENDENT_AMBULATORY_CARE_PROVIDER_SITE_OTHER): Payer: No Typology Code available for payment source | Admitting: Family Medicine

## 2023-07-17 VITALS — BP 122/68 | HR 55 | Ht 68.0 in | Wt 165.0 lb

## 2023-07-17 DIAGNOSIS — R7303 Prediabetes: Secondary | ICD-10-CM

## 2023-07-17 DIAGNOSIS — E7801 Familial hypercholesterolemia: Secondary | ICD-10-CM

## 2023-07-17 MED ORDER — METFORMIN HCL 500 MG PO TABS: ORAL_TABLET | ORAL | 1 refills | Status: DC

## 2023-07-17 MED ORDER — SIMVASTATIN 10 MG PO TABS: 10.0000 mg | ORAL_TABLET | Freq: Every day | ORAL | 1 refills | Status: DC

## 2023-07-17 NOTE — Patient Instructions (Signed)

## 2023-07-17 NOTE — Progress Notes (Signed)
 Date:  07/17/2023   Name:  Wendy Tucker   DOB:  11/11/1950   MRN:  [id number]   Chief Complaint: Diabetes and Hyperlipidemia  Diabetes She presents for her follow-up diabetic visit. She has type 2 diabetes mellitus. Her disease course has been stable. There are no hypoglycemic associated symptoms. Pertinent negatives for hypoglycemia include no confusion, dizziness, headaches, hunger, mood changes, nervousness/anxiousness, pallor, seizures, sleepiness, speech difficulty, sweats or tremors. There are no diabetic associated symptoms. Pertinent negatives for diabetes include no fatigue and no weakness. There are no hypoglycemic complications. Symptoms are stable. There are no diabetic complications. There are no known risk factors for coronary artery disease. Current diabetic treatment includes oral agent (monotherapy). She is compliant with treatment all of the time. Her weight is stable. She is following a generally healthy diet. Meal planning includes avoidance of concentrated sweets and carbohydrate counting.  Hyperlipidemia This is a chronic problem. The current episode started more than 1 year ago. The problem is controlled. Recent lipid tests were reviewed and are normal. She has no history of chronic renal disease, diabetes, hypothyroidism, liver disease, obesity or nephrotic syndrome. There are no known factors aggravating her hyperlipidemia. Pertinent negatives include no myalgias or shortness of breath. Current antihyperlipidemic treatment includes statins. The current treatment provides moderate improvement of lipids. There are no compliance problems.     Lab Results  Component Value Date   NA 140 07/11/2022   K 4.4 07/11/2022   CO2 26 07/11/2022   GLUCOSE 108 (H) 07/11/2022   BUN 8 07/11/2022   CREATININE 0.82 07/11/2022   CALCIUM  9.5 07/11/2022   EGFR 76 07/11/2022   GFRNONAA 59 (L) 05/25/2020   Lab Results  Component Value Date   CHOL 178 07/11/2022   HDL 61 07/11/2022    LDLCALC 98 07/11/2022   TRIG 108 07/11/2022   CHOLHDL 3.3 05/27/2019   Lab Results  Component Value Date   TSH 1.100 05/27/2019   Lab Results  Component Value Date   HGBA1C 6.4 (H) 07/11/2022   Lab Results  Component Value Date   WBC 8.4 02/07/2016   HGB 12.9 02/07/2016   HCT 39.0 02/07/2016   MCV 91 02/07/2016   PLT 206 02/07/2016   Lab Results  Component Value Date   ALT 7 07/11/2022   AST 13 07/11/2022   ALKPHOS 76 07/11/2022   BILITOT 0.3 07/11/2022   Lab Results  Component Value Date   VD25OH 31.9 10/24/2016     Review of Systems  Constitutional: Negative.  Negative for chills, fatigue, fever and unexpected weight change.  HENT:  Negative for congestion, ear discharge, ear pain, rhinorrhea, sinus pressure, sneezing and sore throat.   Respiratory:  Negative for cough, shortness of breath, wheezing and stridor.   Gastrointestinal:  Negative for abdominal pain, blood in stool, constipation, diarrhea and nausea.  Genitourinary:  Negative for dysuria, flank pain, frequency, hematuria, urgency and vaginal discharge.  Musculoskeletal:  Negative for arthralgias, back pain and myalgias.  Skin:  Negative for pallor and rash.  Neurological:  Negative for dizziness, tremors, seizures, speech difficulty, weakness and headaches.  Hematological:  Negative for adenopathy. Does not bruise/bleed easily.  Psychiatric/Behavioral:  Negative for confusion and dysphoric mood. The patient is not nervous/anxious.     Patient Active Problem List   Diagnosis Date Noted   History of colonic polyps    Polyp of colon    Special screening for malignant neoplasms, colon    Polyp of sigmoid colon  Benign neoplasm of descending colon    Hyperlipidemia 11/21/2016   Vitamin D  deficiency 10/24/2016   Osteopenia 10/24/2016   Diabetes mellitus type 2, controlled, without complications (HCC) 10/24/2016   B12 deficiency 10/24/2016   Obesity (BMI 30.0-34.9) 10/24/2016   FH: Alzheimer's  disease 10/24/2016   FH: gastric cancer 10/24/2016   FH: breast cancer 10/24/2016    Allergies  Allergen Reactions   Cephalexin    Cortisone    Amoxicillin Rash    Past Surgical History:  Procedure Laterality Date   BREAST BIOPSY Bilateral    neg- needle bx/clips   BREAST BIOPSY Right 03/18/2019   affirm bx of calcs medial, coil clip, negative   COLONOSCOPY WITH PROPOFOL  N/A 11/28/2016   Procedure: COLONOSCOPY WITH PROPOFOL ;  Surgeon: Jinny Carmine, MD;  Location: [facility];  Service: Endoscopy;  Laterality: N/A;   COLONOSCOPY WITH PROPOFOL  N/A 11/15/2021   Procedure: COLONOSCOPY WITH BIOPSY;  Surgeon: Jinny Carmine, MD;  Location: [facility];  Service: Endoscopy;  Laterality: N/A;   ECTOPIC PREGNANCY SURGERY     fractured leg Right 2008   has metal rods   POLYPECTOMY  11/28/2016   Procedure: POLYPECTOMY;  Surgeon: Jinny Carmine, MD;  Location: [facility];  Service: Endoscopy;;   POLYPECTOMY N/A 11/15/2021   Procedure: POLYPECTOMY;  Surgeon: Jinny Carmine, MD;  Location: [facility];  Service: Endoscopy;  Laterality: N/A;    Social History   Tobacco Use   Smoking status: Never   Smokeless tobacco: Never  Vaping Use   Vaping status: Never Used  Substance Use Topics   Alcohol use: Yes    Comment: Occasionally, 2 glass wine/month   Drug use: No     Medication list has been reviewed and updated.  Current Meds  Medication Sig   Accu-Chek Softclix Lancets lancets Check BS 1 time daily   aspirin  81 MG tablet Take 1 tablet (81 mg total) by mouth daily.   cetirizine (ZYRTEC) 10 MG tablet Take 10 mg by mouth daily.   cholecalciferol (VITAMIN D ) 1000 units tablet Take 1,000 Units by mouth daily.   glucose blood (ACCU-CHEK GUIDE) test strip TEST ONCE DAILY   Multiple Minerals-Vitamins (NUTRA-SUPPORT BONE) CAPS Take 1 capsule by mouth daily.   vitamin B-12 (CYANOCOBALAMIN ) 500 MCG tablet Take 1 tablet (500 mcg total) by mouth daily.    [DISCONTINUED] metFORMIN  (GLUCOPHAGE ) 500 MG tablet TAKE 1 TABLET(500 MG) BY MOUTH TWICE DAILY WITH A MEAL   [DISCONTINUED] simvastatin  (ZOCOR ) 10 MG tablet Take 1 tablet (10 mg total) by mouth at bedtime.   [DISCONTINUED] triamcinolone  cream (KENALOG ) 0.1 % Apply 1 Application topically 2 (two) times daily.       07/17/2023    8:13 AM 01/10/2023    8:54 AM 07/11/2022    8:30 AM 03/06/2022   11:36 AM  GAD 7 : Generalized Anxiety Score  Nervous, Anxious, on Edge 0 0 0 0  Control/stop worrying 0 0 0 0  Worry too much - different things 0 0 0 0  Trouble relaxing 0 0 0 0  Restless 0 0 0 0  Easily annoyed or irritable 0 0 0 0  Afraid - awful might happen 0 0 0 0  Total GAD 7 Score 0 0 0 0  Anxiety Difficulty Not difficult at all Not difficult at all Not difficult at all Not difficult at all       07/17/2023    8:12 AM 01/10/2023    8:54 AM 12/04/2022  3:00 PM  Depression screen PHQ 2/9  Decreased Interest 0 0 0  Down, Depressed, Hopeless 0 0 0  PHQ - 2 Score 0 0 0  Altered sleeping  0 0  Tired, decreased energy  0 0  Change in appetite  0 0  Feeling bad or failure about yourself   0 0  Trouble concentrating  0 0  Moving slowly or fidgety/restless  0 0  Suicidal thoughts  0 0  PHQ-9 Score  0 0  Difficult doing work/chores  Not difficult at all Not difficult at all    BP Readings from Last 3 Encounters:  07/17/23 122/68  01/10/23 126/70  07/11/22 118/70    Physical Exam Vitals and nursing note reviewed.  Constitutional:      General: She is not in acute distress.    Appearance: She is not diaphoretic.  HENT:     Head: Normocephalic and atraumatic.     Right Ear: Tympanic membrane, ear canal and external ear normal.     Left Ear: Tympanic membrane, ear canal and external ear normal.     Nose: Nose normal. No congestion.     Mouth/Throat:     Mouth: Mucous membranes are moist.     Pharynx: No oropharyngeal exudate.  Eyes:     General:        Right eye: No discharge.         Left eye: No discharge.     Conjunctiva/sclera: Conjunctivae normal.     Pupils: Pupils are equal, round, and reactive to light.  Neck:     Thyroid : No thyromegaly.     Vascular: No JVD.  Cardiovascular:     Rate and Rhythm: Normal rate and regular rhythm.     Heart sounds: Normal heart sounds. No murmur heard.    No friction rub. No gallop.  Pulmonary:     Effort: Pulmonary effort is normal.     Breath sounds: Normal breath sounds. No wheezing, rhonchi or rales.  Abdominal:     General: Bowel sounds are normal.     Palpations: Abdomen is soft. There is no mass.     Tenderness: There is no abdominal tenderness. There is no guarding or rebound.  Musculoskeletal:        General: Normal range of motion.     Cervical back: Normal range of motion and neck supple.  Lymphadenopathy:     Cervical: No cervical adenopathy.  Skin:    General: Skin is warm and dry.  Neurological:     Mental Status: She is alert.     Motor: No weakness.     Deep Tendon Reflexes: Reflexes are normal and symmetric.     Wt Readings from Last 3 Encounters:  07/17/23 165 lb (74.8 kg)  01/10/23 165 lb (74.8 kg)  12/04/22 171 lb (77.6 kg)    BP 122/68   Pulse (!) 55   Ht 5' 8 (1.727 m)   Wt 165 lb (74.8 kg)   LMP 02/07/1991   SpO2 98%   BMI 25.09 kg/m   Assessment and Plan:  1. Prediabetes (Primary) Chronic.  Controlled.  Stable.  Asymptomatic.  Tolerating medication well.  Continue metformin  500 mg twice a day.  Will check A1c and microalbuminuria today.  Will recheck renal function panel.  Will recheck patient in 4 months. - metFORMIN  (GLUCOPHAGE ) 500 MG tablet; TAKE 1 TABLET(500 MG) BY MOUTH TWICE DAILY WITH A MEAL  Dispense: 180 tablet; Refill: 1 - Hemoglobin A1c - Microalbumin /  creatinine urine ratio - Microalbumin, urine - Renal Function Panel  2. Familial hypercholesterolemia Chronic.  Controlled.  Stable.  Asymptomatic without myalgias or muscle weakness.  Tolerating medication well.   Patient has been given low-cholesterol low triglyceride dietary guidelines.  Continue current regimen of simvastatin  10 mg once a day.  Will check lipid panel for current level of LDL. - simvastatin  (ZOCOR ) 10 MG tablet; Take 1 tablet (10 mg total) by mouth at bedtime.  Dispense: 90 tablet; Refill: 1 - Lipid Panel With LDL/HDL Ratio    Cathryne Molt, MD

## 2023-07-18 ENCOUNTER — Encounter: Payer: Self-pay | Admitting: Family Medicine

## 2023-07-18 DIAGNOSIS — H40053 Ocular hypertension, bilateral: Secondary | ICD-10-CM | POA: Diagnosis not present

## 2023-07-18 DIAGNOSIS — H2513 Age-related nuclear cataract, bilateral: Secondary | ICD-10-CM | POA: Diagnosis not present

## 2023-07-18 DIAGNOSIS — H40033 Anatomical narrow angle, bilateral: Secondary | ICD-10-CM | POA: Diagnosis not present

## 2023-07-18 LAB — MICROALBUMIN / CREATININE URINE RATIO
Creatinine, Urine: 133.4 mg/dL
Microalb/Creat Ratio: 4 mg/g{creat} (ref 0–29)
Microalbumin, Urine: 4.8 ug/mL

## 2023-07-18 LAB — LIPID PANEL WITH LDL/HDL RATIO
Cholesterol, Total: 201 mg/dL — ABNORMAL HIGH (ref 100–199)
HDL: 83 mg/dL (ref >39)
LDL Chol Calc (NIH): 104 mg/dL — ABNORMAL HIGH (ref 0–99)
LDL/HDL Ratio: 1.3 {ratio} (ref 0.0–3.2)
Triglycerides: 76 mg/dL (ref 0–149)
VLDL Cholesterol Cal: 14 mg/dL (ref 5–40)

## 2023-07-18 LAB — RENAL FUNCTION PANEL
Albumin: 4.3 g/dL (ref 3.8–4.8)
BUN/Creatinine Ratio: 9 — ABNORMAL LOW (ref 12–28)
BUN: 8 mg/dL (ref 8–27)
CO2: 23 mmol/L (ref 20–29)
Calcium: 9.7 mg/dL (ref 8.7–10.3)
Chloride: 105 mmol/L (ref 96–106)
Creatinine, Ser: 0.88 mg/dL (ref 0.57–1.00)
Glucose: 109 mg/dL — ABNORMAL HIGH (ref 70–99)
Phosphorus: 3.6 mg/dL (ref 3.0–4.3)
Potassium: 4.5 mmol/L (ref 3.5–5.2)
Sodium: 142 mmol/L (ref 134–144)
eGFR: 70 mL/min/{1.73_m2} (ref >59)

## 2023-07-18 LAB — HEMOGLOBIN A1C
Est. average glucose Bld gHb Est-mCnc: 131 mg/dL
Hgb A1c MFr Bld: 6.2 % — ABNORMAL HIGH (ref 4.8–5.6)

## 2023-07-19 ENCOUNTER — Encounter: Payer: Self-pay | Admitting: Family Medicine

## 2023-07-25 ENCOUNTER — Ambulatory Visit: Payer: No Typology Code available for payment source | Admitting: Family Medicine

## 2023-07-28 ENCOUNTER — Telehealth: Payer: Self-pay | Admitting: Family Medicine

## 2023-07-28 NOTE — Telephone Encounter (Signed)
Copied from CRM [id number]. Topic: General - Other >> Jul 28, 2023  2:12 PM Turkey B wrote: Reason for CRM: pt called in requesting to get Ultrasound of her breast, because she has disc breast.

## 2023-07-28 NOTE — Telephone Encounter (Signed)
Called pt let her know her mammogram was normal that at this time she does not need a [place]. Pt verbalized understanding.  KP

## 2023-08-15 ENCOUNTER — Telehealth: Payer: Self-pay | Admitting: Family Medicine

## 2023-08-15 NOTE — Telephone Encounter (Signed)
 Copied from CRM [id number]. Topic: General - Other >> Aug 15, 2023  1:47 PM Santiya F wrote: Reason for CRM: Pt is calling in because she had a mammogram done and pt says they wanted her to get an ultrasound scheduled. Pt is calling in requesting that be done. Please advise.

## 2023-08-18 NOTE — Telephone Encounter (Signed)
 Pt would like Dr. Rochelle Chu or her nurse o call her today.

## 2023-08-18 NOTE — Telephone Encounter (Signed)
 Patient informed that I called and clarified with [place] if patient needs US . They said US  is not needed unless pateint is having a new problem because Mammo was normal. Patient informed of this. Told her to repeat mammogram in Nov of this year.

## 2023-08-18 NOTE — Telephone Encounter (Signed)
 Called and left patient VM informing per her last Mammo- it was negative/normal and US  is not needed. Told her on VM she will need to repeat Mammo in November of this year. - Shi Blankenship

## 2023-08-18 NOTE — Telephone Encounter (Signed)
 Pt called back wanting to speak with Chassidy. Pt states that Dr. Rochelle Chu told her if [place] states that she needs an ultrasound, then she can get her ultrasound. Pt is wanting the ultrasound done. Please have Chassidy call the pt back.

## 2023-08-21 DIAGNOSIS — Z6826 Body mass index (BMI) 26.0-26.9, adult: Secondary | ICD-10-CM | POA: Diagnosis not present

## 2023-08-21 DIAGNOSIS — E785 Hyperlipidemia, unspecified: Secondary | ICD-10-CM | POA: Diagnosis not present

## 2023-08-21 DIAGNOSIS — Z008 Encounter for other general examination: Secondary | ICD-10-CM | POA: Diagnosis not present

## 2023-08-21 DIAGNOSIS — E663 Overweight: Secondary | ICD-10-CM | POA: Diagnosis not present

## 2023-08-21 DIAGNOSIS — E1169 Type 2 diabetes mellitus with other specified complication: Secondary | ICD-10-CM | POA: Diagnosis not present

## 2023-09-03 ENCOUNTER — Encounter: Payer: Self-pay | Admitting: Internal Medicine

## 2023-09-03 ENCOUNTER — Ambulatory Visit (INDEPENDENT_AMBULATORY_CARE_PROVIDER_SITE_OTHER): Payer: No Typology Code available for payment source | Admitting: Internal Medicine

## 2023-09-03 ENCOUNTER — Ambulatory Visit: Payer: Self-pay | Admitting: Family Medicine

## 2023-09-03 VITALS — BP 118/64 | HR 67 | Ht 68.0 in | Wt 167.1 lb

## 2023-09-03 DIAGNOSIS — L03314 Cellulitis of groin: Secondary | ICD-10-CM | POA: Diagnosis not present

## 2023-09-03 MED ORDER — DOXYCYCLINE HYCLATE 100 MG PO TABS: 100.0000 mg | ORAL_TABLET | Freq: Two times a day (BID) | ORAL | 0 refills | Status: AC

## 2023-09-03 NOTE — Progress Notes (Signed)
 Date:  09/03/2023   Name:  Wendy Tucker   DOB:  03-Feb-1951   MRN:  [id number]   Chief Complaint: Abscess (upper right thigh, pain 8/10, )  HPI Skin lesion on right right groin - started last week as a small pimple which she expressed.  Over the past few days the area has become larger and deep but not painful.  She felt a lump in the right groin.  No fever or chills, no n/v.  She has applied TAO to the area.  Review of Systems  Constitutional:  Negative for chills, fatigue and fever.  Respiratory:  Negative for chest tightness and shortness of breath.   Cardiovascular:  Negative for chest pain.  Skin:        Mass in right groin     Lab Results  Component Value Date   NA 142 07/17/2023   K 4.5 07/17/2023   CO2 23 07/17/2023   GLUCOSE 109 (H) 07/17/2023   BUN 8 07/17/2023   CREATININE 0.88 07/17/2023   CALCIUM 9.7 07/17/2023   EGFR 70 07/17/2023   GFRNONAA 59 (L) 05/25/2020   Lab Results  Component Value Date   CHOL 201 (H) 07/17/2023   HDL 83 07/17/2023   LDLCALC 104 (H) 07/17/2023   TRIG 76 07/17/2023   CHOLHDL 3.3 05/27/2019   Lab Results  Component Value Date   TSH 1.100 05/27/2019   Lab Results  Component Value Date   HGBA1C 6.2 (H) 07/17/2023   Lab Results  Component Value Date   WBC 8.4 02/07/2016   HGB 12.9 02/07/2016   HCT 39.0 02/07/2016   MCV 91 02/07/2016   PLT 206 02/07/2016   Lab Results  Component Value Date   ALT 7 07/11/2022   AST 13 07/11/2022   ALKPHOS 76 07/11/2022   BILITOT 0.3 07/11/2022   Lab Results  Component Value Date   VD25OH 31.9 10/24/2016     Patient Active Problem List   Diagnosis Date Noted   History of colonic polyps    Polyp of colon    Special screening for malignant neoplasms, colon    Polyp of sigmoid colon    Benign neoplasm of descending colon    Hyperlipidemia 11/21/2016   Vitamin D deficiency 10/24/2016   Osteopenia 10/24/2016   Diabetes mellitus type 2, controlled, without complications (HCC)  10/24/2016   B12 deficiency 10/24/2016   Obesity (BMI 30.0-34.9) 10/24/2016   FH: Alzheimer's disease 10/24/2016   FH: gastric cancer 10/24/2016   FH: breast cancer 10/24/2016    Allergies  Allergen Reactions   Cephalexin    Cortisone    Amoxicillin Rash    Past Surgical History:  Procedure Laterality Date   BREAST BIOPSY Bilateral    neg- needle bx/clips   BREAST BIOPSY Right 03/18/2019   affirm bx of calcs medial, coil clip, negative   COLONOSCOPY WITH PROPOFOL N/A 11/28/2016   Procedure: COLONOSCOPY WITH PROPOFOL;  Surgeon: Midge Minium, MD;  Location: [facility];  Service: Endoscopy;  Laterality: N/A;   COLONOSCOPY WITH PROPOFOL N/A 11/15/2021   Procedure: COLONOSCOPY WITH BIOPSY;  Surgeon: Midge Minium, MD;  Location: [facility];  Service: Endoscopy;  Laterality: N/A;   ECTOPIC PREGNANCY SURGERY     fractured leg Right 2008   has metal rods   POLYPECTOMY  11/28/2016   Procedure: POLYPECTOMY;  Surgeon: Midge Minium, MD;  Location: [facility];  Service: Endoscopy;;   POLYPECTOMY N/A 11/15/2021   Procedure: POLYPECTOMY;  Surgeon:  Midge Minium, MD;  Location: [facility];  Service: Endoscopy;  Laterality: N/A;    Social History   Tobacco Use   Smoking status: Never   Smokeless tobacco: Never  Vaping Use   Vaping status: Never Used  Substance Use Topics   Alcohol use: Yes    Comment: Occasionally, 2 glass wine/month   Drug use: No     Medication list has been reviewed and updated.  Current Meds  Medication Sig   Accu-Chek Softclix Lancets lancets Check BS 1 time daily   aspirin 81 MG tablet Take 1 tablet (81 mg total) by mouth daily.   cetirizine (ZYRTEC) 10 MG tablet Take 10 mg by mouth daily.   cholecalciferol (VITAMIN D) 1000 units tablet Take 1,000 Units by mouth daily.   doxycycline (VIBRA-TABS) 100 MG tablet Take 1 tablet (100 mg total) by mouth 2 (two) times daily for 10 days.   glucose blood (ACCU-CHEK GUIDE) test strip  TEST ONCE DAILY   metFORMIN (GLUCOPHAGE) 500 MG tablet TAKE 1 TABLET(500 MG) BY MOUTH TWICE DAILY WITH A MEAL   Multiple Minerals-Vitamins (NUTRA-SUPPORT BONE) CAPS Take 1 capsule by mouth daily.   simvastatin (ZOCOR) 10 MG tablet Take 1 tablet (10 mg total) by mouth at bedtime.   vitamin B-12 (CYANOCOBALAMIN) 500 MCG tablet Take 1 tablet (500 mcg total) by mouth daily.       09/03/2023    1:30 PM 07/17/2023    8:13 AM 01/10/2023    8:54 AM 07/11/2022    8:30 AM  GAD 7 : Generalized Anxiety Score  Nervous, Anxious, on Edge 0 0 0 0  Control/stop worrying 0 0 0 0  Worry too much - different things 0 0 0 0  Trouble relaxing 0 0 0 0  Restless 0 0 0 0  Easily annoyed or irritable 0 0 0 0  Afraid - awful might happen 0 0 0 0  Total GAD 7 Score 0 0 0 0  Anxiety Difficulty Not difficult at all Not difficult at all Not difficult at all Not difficult at all       09/03/2023    1:30 PM 07/17/2023    8:12 AM 01/10/2023    8:54 AM  Depression screen PHQ 2/9  Decreased Interest 0 0 0  Down, Depressed, Hopeless 0 0 0  PHQ - 2 Score 0 0 0  Altered sleeping   0  Tired, decreased energy   0  Change in appetite   0  Feeling bad or failure about yourself    0  Trouble concentrating   0  Moving slowly or fidgety/restless   0  Suicidal thoughts   0  PHQ-9 Score   0  Difficult doing work/chores   Not difficult at all    BP Readings from Last 3 Encounters:  09/03/23 118/64  07/17/23 122/68  01/10/23 126/70    Physical Exam Vitals and nursing note reviewed.  Constitutional:      General: She is not in acute distress.    Appearance: She is well-developed.  HENT:     Head: Normocephalic and atraumatic.  Pulmonary:     Effort: Pulmonary effort is normal. No respiratory distress.  Genitourinary:      Comments: Firm indurated 1 x 0.5 cm lesion - no pore or opening Skin:    General: Skin is warm and dry.     Findings: No rash.  Neurological:     Mental Status: She is alert and oriented to  person,  place, and time.  Psychiatric:        Mood and Affect: Mood normal.        Behavior: Behavior normal.     Wt Readings from Last 3 Encounters:  09/03/23 167 lb 2 oz (75.8 kg)  07/17/23 165 lb (74.8 kg)  01/10/23 165 lb (74.8 kg)    BP 118/64   Pulse 67   Ht 5\' 8"  (1.727 m)   Wt 167 lb 2 oz (75.8 kg)   LMP 02/07/1991   SpO2 97%   BMI 25.41 kg/m   Assessment and Plan:  Problem List Items Addressed This Visit   None Visit Diagnoses       Cellulitis of groin, right    -  Primary   local care - no indication for I&D follow up if needed   Relevant Medications   doxycycline (VIBRA-TABS) 100 MG tablet       No follow-ups on file.    Reubin Milan, MD [facility] [place]

## 2023-09-03 NOTE — Telephone Encounter (Signed)
Noted  Pt has an appt  KP 

## 2023-09-03 NOTE — Telephone Encounter (Signed)
 Chief Complaint: Boil  Symptoms: pain, redness, lump under skin Frequency: since last week Pertinent Negatives: Patient denies fever, rash Disposition: [] ED /[] Urgent Care (no appt availability in office) / [] Appointment(In office/virtual)/ []  [place] [facility]/ [] Home Care/ [] Refused Recommended Disposition /[] [place] [facility]/ []  Follow-up with PCP Additional Notes: Patient called in stating she has a boil on her inner right thigh in the crease. Patient states she first noticed it last week and she attempted to pop/drain it last week, but it is still there and rating 10/10 pain. Patient reports the appearance today as red, and feels like a knot under the skin. Patient states she has been keeping it clean and covered, using a warm compress on it and antibiotic ointment. Patient appt made for evaluation today.   Copied from CRM [id number]. Topic: Clinical - Red Word Triage >> Sep 03, 2023  9:30 AM Shon Hale wrote: Red Word that prompted transfer to Nurse Triage: Boil on right thigh, causing patient pain, rating it at a 10. Requesting appointment. Reason for Disposition  SEVERE pain (e.g., excruciating)  Answer Assessment - Initial Assessment Questions 1. APPEARANCE of BOIL: "What does the boil look like?"      Bump under skin, red 2. LOCATION: "Where is the boil located?"      Right inner thigh in crease  3. NUMBER: "How many boils are there?"      1 4. SIZE: "How big is the boil?" (e.g., inches, cm; compare to size of a coin or other object)     Can't determine size but feels like knot under skin 5. ONSET: "When did the boil start?"     Last week 6. PAIN: "Is there any pain?" If Yes, ask: "How bad is the pain?"   (Scale 1-10; or mild, moderate, severe)     Yes - 10/10 7. FEVER: "Do you have a fever?" If Yes, ask: "What is it, how was it measured, and when did it start?"      No 8. SOURCE: "Have you been around anyone with boils or other Staph infections?" "Have you ever had  boils before?"     Patient is unsure 9. OTHER SYMPTOMS: "Do you have any other symptoms?" (e.g., shaking chills, weakness, rash elsewhere on body)     No  Protocols used: Boil (Skin Abscess)-A-AH

## 2023-09-09 DIAGNOSIS — L501 Idiopathic urticaria: Secondary | ICD-10-CM | POA: Diagnosis not present

## 2023-09-12 ENCOUNTER — Other Ambulatory Visit: Payer: Self-pay | Admitting: Family Medicine

## 2023-09-12 DIAGNOSIS — L03314 Cellulitis of groin: Secondary | ICD-10-CM

## 2023-09-12 NOTE — Telephone Encounter (Signed)
 Copied from CRM [id number]. Topic: Clinical - Medication Refill >> Sep 12, 2023  1:47 PM Higinio Roger wrote: Most Recent Primary Care Visit:  Provider: Reubin Milan  Department: [facility] [place]  Visit Type: ACUTE  Date: 09/03/2023  Medication: doxycycline (VIBRA-TABS) 100 MG tablet   Has the patient contacted their pharmacy? Yes (Agent: If no, request that the patient contact the pharmacy for the refill. If patient does not wish to contact the pharmacy document the reason why and proceed with request.) (Agent: If yes, when and what did the pharmacy advise?) Contact doctor  Is this the correct pharmacy for this prescription? Yes If no, delete pharmacy and type the correct one.  This is the patient's preferred pharmacy:   [id number], [place] - [address] AT [facility] [address] [place] [address] [place] [address] [place] [address] Phone: [phone] Fax: [phone]   Has the prescription been filled recently? Yes  Is the patient out of the medication? Yes  Has the patient been seen for an appointment in the last year OR does the patient have an upcoming appointment? Yes  Can we respond through MyChart? Yes  Agent: Please be advised that Rx refills may take up to 3 business days. We ask that you follow-up with your pharmacy.

## 2023-09-15 NOTE — Telephone Encounter (Signed)
 Called pt scheduled an appt for. Told pt that she is not due for a mammogram until 05/2024. Pt verbalized understanding. Told pt that per her last mammogram she did not need a [place] only a screening mammogram.  KP  Copied from CRM [id number]. Topic: Clinical - Request for Lab/Test Order >> Sep 12, 2023 10:43 AM Ivette P wrote: Reason for CRM: pt called in because [facility] requesting for the ultrasound for pt, when pt went to nurse to schedule ultrasound, nurse told pt that ultrasound was not needed. Pt would still like a ultrasound and would like to speak to Elizabeth Sauer or Elizabeth Sauer Nurse about the situation. Pt callback [phone]

## 2023-09-15 NOTE — Telephone Encounter (Signed)
 Requested medication (s) are due for refill today:   Requested medication (s) are on the active medication list: Yes  Last refill:  09/03/23  Future visit scheduled: Yes  Notes to clinic:  See request.    Requested Prescriptions  Pending Prescriptions Disp Refills   doxycycline (VIBRA-TABS) 100 MG tablet 20 tablet 0    Sig: Take 1 tablet (100 mg total) by mouth 2 (two) times daily for 10 days.     There is no refill protocol information for this order

## 2023-09-18 ENCOUNTER — Ambulatory Visit: Admitting: Family Medicine

## 2023-11-06 ENCOUNTER — Ambulatory Visit: Payer: Self-pay | Admitting: Family Medicine

## 2023-11-13 ENCOUNTER — Ambulatory Visit (INDEPENDENT_AMBULATORY_CARE_PROVIDER_SITE_OTHER): Admitting: Family Medicine

## 2023-11-13 ENCOUNTER — Telehealth: Payer: Self-pay | Admitting: Family Medicine

## 2023-11-13 ENCOUNTER — Encounter: Payer: Self-pay | Admitting: Family Medicine

## 2023-11-13 VITALS — BP 139/67 | HR 53 | Temp 97.5°F | Resp 18 | Ht 68.0 in | Wt 165.0 lb

## 2023-11-13 DIAGNOSIS — Z1231 Encounter for screening mammogram for malignant neoplasm of breast: Secondary | ICD-10-CM | POA: Diagnosis not present

## 2023-11-13 DIAGNOSIS — R7303 Prediabetes: Secondary | ICD-10-CM

## 2023-11-13 DIAGNOSIS — E782 Mixed hyperlipidemia: Secondary | ICD-10-CM

## 2023-11-13 DIAGNOSIS — E538 Deficiency of other specified B group vitamins: Secondary | ICD-10-CM

## 2023-11-13 DIAGNOSIS — E118 Type 2 diabetes mellitus with unspecified complications: Secondary | ICD-10-CM

## 2023-11-13 DIAGNOSIS — R5383 Other fatigue: Secondary | ICD-10-CM | POA: Diagnosis not present

## 2023-11-13 DIAGNOSIS — Z7984 Long term (current) use of oral hypoglycemic drugs: Secondary | ICD-10-CM

## 2023-11-13 DIAGNOSIS — E559 Vitamin D deficiency, unspecified: Secondary | ICD-10-CM

## 2023-11-13 NOTE — Assessment & Plan Note (Signed)
 Review of her chart shows she has not had a thyroid  screening in a number of years.  Checking TSH and free T4 today

## 2023-11-13 NOTE — Telephone Encounter (Signed)
 Returned patient's call and LM that I called.  She had left a message that she had gotten an US .  Not certain what she got an US  of.

## 2023-11-13 NOTE — Progress Notes (Signed)
 New Patient Office Visit  Subjective    Patient ID: Wendy Tucker, female    DOB: [birth year]  Age: 73 y.o. MRN: [id number]  CC:  Chief Complaint  Patient presents with   Establish Care    HPI Wendy Tucker presents to establish care. [name] here to establish care.  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  She push mows her front yard once a week for the exercise.  Has no complaints or problems today.  No complaints of joint pain.  Takes an 81 mg aspirin  daily.  Checks her feet daily.  Wears compression stockings and does not have peripheral edema.  Denies shortness of breath, PND, orthopnea and chest pains. PHQ-9 is 0 and GAD-7 is 0   Outpatient Encounter Medications as of 11/13/2023  Medication Sig   Accu-Chek Softclix Lancets lancets Check BS 1 time daily   aspirin  81 MG tablet Take 1 tablet (81 mg total) by mouth daily.   cetirizine (ZYRTEC) 10 MG tablet Take 10 mg by mouth daily.   cholecalciferol (VITAMIN D ) 1000 units tablet Take 1,000 Units by mouth daily.   glucose blood (ACCU-CHEK GUIDE) test strip TEST ONCE DAILY   metFORMIN  (GLUCOPHAGE ) 500 MG tablet TAKE 1 TABLET(500 MG) BY MOUTH TWICE DAILY WITH A MEAL   Multiple Minerals-Vitamins (NUTRA-SUPPORT BONE) CAPS Take 1 capsule by mouth daily.   simvastatin  (ZOCOR ) 10 MG tablet Take 1 tablet (10 mg total) by mouth at bedtime.   vitamin B-12 (CYANOCOBALAMIN ) 500 MCG tablet Take 1 tablet (500 mcg total) by mouth daily.   EPINEPHrine  0.3 mg/0.3 mL IJ SOAJ injection Inject 0.3 mg into the muscle as needed for anaphylaxis. (Patient not taking: Reported on 11/13/2023)   No facility-administered encounter medications on file as of 11/13/2023.    Past Medical History:  Diagnosis Date   Diabetes mellitus without complication (HCC)    Eye infection, right    Hyperlipidemia    Vertigo    x1, over  20 yrs ago    Past Surgical History:  Procedure Laterality Date   BREAST BIOPSY Bilateral    neg- needle bx/clips   BREAST BIOPSY Right 03/18/2019   affirm bx of calcs medial, coil clip, negative   COLONOSCOPY WITH PROPOFOL  N/A 11/28/2016   Procedure: COLONOSCOPY WITH PROPOFOL ;  Surgeon: Marnee Sink, MD;  Location: [facility];  Service: Endoscopy;  Laterality: N/A;   COLONOSCOPY WITH PROPOFOL  N/A 11/15/2021   Procedure: COLONOSCOPY WITH BIOPSY;  Surgeon: Marnee Sink, MD;  Location: [facility];  Service: Endoscopy;  Laterality: N/A;   ECTOPIC PREGNANCY SURGERY     fractured leg Right 2008   has metal rods   POLYPECTOMY  11/28/2016   Procedure: POLYPECTOMY;  Surgeon: Marnee Sink, MD;  Location: [facility];  Service: Endoscopy;;   POLYPECTOMY N/A 11/15/2021   Procedure: POLYPECTOMY;  Surgeon: Marnee Sink, MD;  Location: [facility];  Service: Endoscopy;  Laterality: N/A;    Family History  Problem Relation Age of Onset   Hypertension Mother    Stomach cancer Father    Breast cancer Sister 28   Bone cancer Sister     Social History   Socioeconomic History   Marital status: Single    Spouse name: Not on file   Number of children: 0   Years of education: Not on file   Highest education  level: Associate degree: occupational, Scientist, product/process development, or vocational program  Occupational History   Not on file  Tobacco Use   Smoking status: Never    Passive exposure: Never   Smokeless tobacco: Never  Vaping Use   Vaping status: Never Used  Substance and Sexual Activity   Alcohol use: Yes    Comment: Occasionally, 2 glass wine/month   Drug use: No   Sexual activity: Yes    Birth control/protection: Post-menopausal  Other Topics Concern   Not on file  Social History Narrative   Pt lives alone   Social Drivers of Health   Financial Resource Strain: Medium Risk (11/09/2023)   Overall Financial Resource Strain (CARDIA)    Difficulty of Paying Living  Expenses: Somewhat hard  Food Insecurity: No Food Insecurity (11/09/2023)   Hunger Vital Sign    Worried About Running Out of Food in the Last Year: Never true    Ran Out of Food in the Last Year: Never true  Transportation Needs: No Transportation Needs (11/09/2023)   PRAPARE - Administrator, Civil Service (Medical): No    Lack of Transportation (Non-Medical): No  Physical Activity: Unknown (11/09/2023)   Exercise Vital Sign    Days of Exercise per Week: 2 days    Minutes of Exercise per Session: Patient declined  Stress: No Stress Concern Present (11/09/2023)   [employer] of Occupational Health - Occupational Stress Questionnaire    Feeling of Stress : Not at all  Social Connections: Moderately Isolated (11/09/2023)   Social Connection and Isolation Panel [NHANES]    Frequency of Communication with Friends and Family: More than three times a week    Frequency of Social Gatherings with Friends and Family: Once a week    Attends Religious Services: More than 4 times per year    Active Member of [employer] or Organizations: No    Attends Banker Meetings: Never    Marital Status: Divorced  Catering manager Violence: Not At Risk (12/04/2022)   Humiliation, Afraid, Rape, and Kick questionnaire    Fear of Current or Ex-Partner: No    Emotionally Abused: No    Physically Abused: No    Sexually Abused: No    ROS      Objective   BP 139/67 (BP Location: Left Arm, Patient Position: Sitting, Cuff Size: Normal)   Pulse (!) 53   Temp (!) 97.5 F (36.4 C) (Oral)   Resp 18   Ht 5\' 8"  (1.727 m)   Wt 165 lb (74.8 kg)   LMP 02/07/1991   SpO2 98%   BMI 25.09 kg/m    Physical Exam Vitals and nursing note reviewed.  Constitutional:      Appearance: Normal appearance.  HENT:     Head: Normocephalic and atraumatic.  Eyes:     Conjunctiva/sclera: Conjunctivae normal.  Cardiovascular:     Rate and Rhythm: Normal rate and regular rhythm.     Pulses:           Dorsalis pedis pulses are 2+ on the right side and 2+ on the left side.       Posterior tibial pulses are 2+ on the right side and 2+ on the left side.  Pulmonary:     Effort: Pulmonary effort is normal.     Breath sounds: Normal breath sounds.  Musculoskeletal:     Right lower leg: No edema.     Left lower leg: No edema.  Feet:     Right foot:  Protective Sensation: 3 sites tested.  3 sites sensed.     Skin integrity: Skin integrity normal.     Toenail Condition: Right toenails are normal.     Left foot:     Protective Sensation: 3 sites tested.  3 sites sensed.     Skin integrity: Skin integrity normal.     Toenail Condition: Left toenails are normal.  Skin:    General: Skin is warm and dry.  Neurological:     Mental Status: She is alert and oriented to person, place, and time.  Psychiatric:        Mood and Affect: Mood normal.        Behavior: Behavior normal.        Thought Content: Thought content normal.        Judgment: Judgment normal.            The 10-year ASCVD risk score (Arnett DK, et al., 2019) is: 35.8%     Assessment & Plan:  Other fatigue Assessment & Plan: Review of her chart shows she has not had a thyroid  screening in a number of years.  Checking TSH and free T4 today  Orders: -     TSH + free T4; Future  Type 2 diabetes with complication (HCC) Assessment & Plan: On metformin  500 mg twice daily.  She is not on an ACE or ARB.  Checking urine micro albumin creatinine ratio.  Orders: -     Lipid panel -     CBC with Differential/Platelet -     Comprehensive metabolic panel with GFR -     Hemoglobin A1c -     Microalbumin / creatinine urine ratio  Screening mammogram for breast cancer -     3D Screening Mammogram, Left and Right; Future  B12 deficiency Assessment & Plan: Takes 500 mcg vitamin B12 daily.  Checking her levels today   Vitamin D  deficiency Assessment & Plan: Takes vitamin D3 1000 international units daily.  Checking  her level today   Mixed hyperlipidemia Assessment & Plan: Take simvastatin  10 mg daily 07/17/2023 total cholesterol 201, trig 76, HDL 83 and LDL 104.     Return in about 6 months (around 05/15/2024).   Tracy Gerken K Parry Po, MD

## 2023-11-13 NOTE — Assessment & Plan Note (Signed)
 Takes 500 mcg vitamin B12 daily.  Checking her levels today

## 2023-11-13 NOTE — Assessment & Plan Note (Signed)
 Take simvastatin  10 mg daily 07/17/2023 total cholesterol 201, trig 76, HDL 83 and LDL 104.

## 2023-11-13 NOTE — Telephone Encounter (Signed)
 Copied from CRM [id number]. Topic: General - Other >> Nov 13, 2023  9:30 AM Alysia Jumbo S wrote: Reason for CRM: Patient is calling to inform provider that it was an Ultrasound and not a screening from previous provider.

## 2023-11-13 NOTE — Assessment & Plan Note (Signed)
 On metformin  500 mg twice daily.  She is not on an ACE or ARB.  Checking urine micro albumin creatinine ratio.

## 2023-11-13 NOTE — Assessment & Plan Note (Signed)
 Takes vitamin D3 1000 international units daily.  Checking her level today

## 2023-11-14 LAB — LIPID PANEL
Chol/HDL Ratio: 2.3 ratio (ref 0.0–4.4)
Cholesterol, Total: 184 mg/dL (ref 100–199)
HDL: 80 mg/dL (ref >39)
LDL Chol Calc (NIH): 92 mg/dL (ref 0–99)
Triglycerides: 65 mg/dL (ref 0–149)
VLDL Cholesterol Cal: 12 mg/dL (ref 5–40)

## 2023-11-14 LAB — MICROALBUMIN / CREATININE URINE RATIO
Creatinine, Urine: 103.1 mg/dL
Microalb/Creat Ratio: 4 mg/g{creat} (ref 0–29)
Microalbumin, Urine: 4.1 ug/mL

## 2023-11-14 LAB — COMPREHENSIVE METABOLIC PANEL WITH GFR
ALT: 12 IU/L (ref 0–32)
AST: 21 IU/L (ref 0–40)
Albumin: 4.2 g/dL (ref 3.8–4.8)
Alkaline Phosphatase: 74 IU/L (ref 44–121)
BUN/Creatinine Ratio: 11 — ABNORMAL LOW (ref 12–28)
BUN: 10 mg/dL (ref 8–27)
Bilirubin Total: 0.4 mg/dL (ref 0.0–1.2)
CO2: 20 mmol/L (ref 20–29)
Calcium: 9.8 mg/dL (ref 8.7–10.3)
Chloride: 105 mmol/L (ref 96–106)
Creatinine, Ser: 0.89 mg/dL (ref 0.57–1.00)
Globulin, Total: 2.5 g/dL (ref 1.5–4.5)
Glucose: 90 mg/dL (ref 70–99)
Potassium: 4.3 mmol/L (ref 3.5–5.2)
Sodium: 141 mmol/L (ref 134–144)
Total Protein: 6.7 g/dL (ref 6.0–8.5)
eGFR: 69 mL/min/{1.73_m2} (ref >59)

## 2023-11-14 LAB — CBC WITH DIFFERENTIAL/PLATELET
Basophils Absolute: 0 10*3/uL (ref 0.0–0.2)
Basos: 0 %
EOS (ABSOLUTE): 0.1 10*3/uL (ref 0.0–0.4)
Eos: 1 %
Hematocrit: 37.3 % (ref 34.0–46.6)
Hemoglobin: 12 g/dL (ref 11.1–15.9)
Immature Grans (Abs): 0 10*3/uL (ref 0.0–0.1)
Immature Granulocytes: 0 %
Lymphocytes Absolute: 3.4 10*3/uL — ABNORMAL HIGH (ref 0.7–3.1)
Lymphs: 39 %
MCH: 30.5 pg (ref 26.6–33.0)
MCHC: 32.2 g/dL (ref 31.5–35.7)
MCV: 95 fL (ref 79–97)
Monocytes Absolute: 0.6 10*3/uL (ref 0.1–0.9)
Monocytes: 7 %
Neutrophils Absolute: 4.5 10*3/uL (ref 1.4–7.0)
Neutrophils: 53 %
Platelets: 185 10*3/uL (ref 150–450)
RBC: 3.93 x10E6/uL (ref 3.77–5.28)
RDW: 13.2 % (ref 11.7–15.4)
WBC: 8.7 10*3/uL (ref 3.4–10.8)

## 2023-11-14 LAB — HEMOGLOBIN A1C
Est. average glucose Bld gHb Est-mCnc: 131 mg/dL
Hgb A1c MFr Bld: 6.2 % — ABNORMAL HIGH (ref 4.8–5.6)

## 2023-11-17 ENCOUNTER — Encounter: Payer: Self-pay | Admitting: Family Medicine

## 2023-11-25 ENCOUNTER — Telehealth: Payer: Self-pay

## 2023-11-25 NOTE — Telephone Encounter (Signed)
 Copied from CRM [id number]. Topic: General - Other >> Nov 25, 2023  2:32 PM DeAngela L wrote: Reason for CRM: Patient would like to let Dr Ziglar no she didn't have an ultrasound  Pt said Dr Rochelle Chu said she didn't need the ultrasound  Patient is responding to message from a week ago she said her phone was not working right

## 2023-12-10 ENCOUNTER — Ambulatory Visit: Payer: Self-pay

## 2024-01-16 DIAGNOSIS — H2513 Age-related nuclear cataract, bilateral: Secondary | ICD-10-CM | POA: Diagnosis not present

## 2024-01-16 DIAGNOSIS — E119 Type 2 diabetes mellitus without complications: Secondary | ICD-10-CM | POA: Diagnosis not present

## 2024-01-16 DIAGNOSIS — H40053 Ocular hypertension, bilateral: Secondary | ICD-10-CM | POA: Diagnosis not present

## 2024-01-16 LAB — HM DIABETES EYE EXAM

## 2024-01-20 ENCOUNTER — Telehealth: Payer: Self-pay

## 2024-01-20 DIAGNOSIS — E7801 Familial hypercholesterolemia: Secondary | ICD-10-CM

## 2024-01-20 MED ORDER — SIMVASTATIN 10 MG PO TABS: 10.0000 mg | ORAL_TABLET | Freq: Every day | ORAL | 1 refills | Status: DC

## 2024-01-20 NOTE — Telephone Encounter (Signed)
Simvastatin refill sent.

## 2024-01-26 ENCOUNTER — Telehealth: Payer: Self-pay

## 2024-01-26 MED ORDER — METFORMIN HCL 500 MG PO TABS: ORAL_TABLET | ORAL | 1 refills | Status: DC

## 2024-01-26 NOTE — Addendum Note (Signed)
 Addended by: Mykeria Garman K on: 01/26/2024 04:57 PM   Modules accepted: Orders

## 2024-01-26 NOTE — Telephone Encounter (Signed)
 Med refill for Metformin  500 mg BID was sent to our office. Pls review and send if necessary., Thanks!

## 2024-02-06 DIAGNOSIS — H10811 Pingueculitis, right eye: Secondary | ICD-10-CM | POA: Diagnosis not present

## 2024-02-06 DIAGNOSIS — H5789 Other specified disorders of eye and adnexa: Secondary | ICD-10-CM | POA: Diagnosis not present

## 2024-02-06 DIAGNOSIS — H2513 Age-related nuclear cataract, bilateral: Secondary | ICD-10-CM | POA: Diagnosis not present

## 2024-03-15 DIAGNOSIS — L501 Idiopathic urticaria: Secondary | ICD-10-CM | POA: Diagnosis not present

## 2024-04-07 ENCOUNTER — Telehealth: Payer: Self-pay | Admitting: Family Medicine

## 2024-04-07 NOTE — Telephone Encounter (Signed)
 04/07/24 @ 2:55 LVM for patient to call and r/s / Provider will not be in the office on 05/13/24.

## 2024-05-05 ENCOUNTER — Telehealth: Payer: Self-pay | Admitting: Family Medicine

## 2024-05-05 NOTE — Telephone Encounter (Signed)
 Reached out to pt to r/s appt for 05/13/2024 due to provider not being in office. PT is currently at work and would like a call back around 2pm today (05/05/2024) to r/s this appt.

## 2024-05-13 ENCOUNTER — Ambulatory Visit: Admitting: Family Medicine

## 2024-05-13 ENCOUNTER — Telehealth: Payer: Self-pay | Admitting: Family Medicine

## 2024-05-13 NOTE — Telephone Encounter (Signed)
 Copied from CRM [id number]. Topic: Appointments - Appointment Info/Confirmation >> May 12, 2024  2:01 PM Avram MATSU wrote: Patient/patient representative is calling for information regarding an appointment.   Returned call and lvm with appointment details

## 2024-05-14 ENCOUNTER — Ambulatory Visit (INDEPENDENT_AMBULATORY_CARE_PROVIDER_SITE_OTHER): Admitting: Family Medicine

## 2024-05-14 ENCOUNTER — Encounter: Payer: Self-pay | Admitting: Family Medicine

## 2024-05-14 VITALS — BP 131/70 | HR 53 | Temp 97.9°F | Resp 18 | Ht 68.0 in | Wt 169.0 lb

## 2024-05-14 DIAGNOSIS — Z7984 Long term (current) use of oral hypoglycemic drugs: Secondary | ICD-10-CM

## 2024-05-14 DIAGNOSIS — E118 Type 2 diabetes mellitus with unspecified complications: Secondary | ICD-10-CM

## 2024-05-14 DIAGNOSIS — E782 Mixed hyperlipidemia: Secondary | ICD-10-CM

## 2024-05-14 DIAGNOSIS — Z1231 Encounter for screening mammogram for malignant neoplasm of breast: Secondary | ICD-10-CM

## 2024-05-14 DIAGNOSIS — E78019 Familial hypercholesterolemia, unspecified: Secondary | ICD-10-CM

## 2024-05-14 DIAGNOSIS — R7303 Prediabetes: Secondary | ICD-10-CM

## 2024-05-14 MED ORDER — METFORMIN HCL 500 MG PO TABS: ORAL_TABLET | ORAL | 1 refills | Status: DC

## 2024-05-14 MED ORDER — ATORVASTATIN CALCIUM 20 MG PO TABS: 20.0000 mg | ORAL_TABLET | Freq: Every day | ORAL | 3 refills | Status: DC

## 2024-05-14 MED ORDER — SIMVASTATIN 10 MG PO TABS: 10.0000 mg | ORAL_TABLET | Freq: Every day | ORAL | 1 refills | Status: DC

## 2024-05-14 NOTE — Progress Notes (Signed)
 Established Patient Office Visit  Subjective   Patient ID: Wendy Tucker, female    DOB: 01-23-1951  Age: 73 y.o. 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 She push mows her front yard once a week for the exercise.   She was doing well with her diabetes.  She takes metformin  500 mg twice daily.  It is the immediate release and not sustained-release.  Discussed changing it to sustained-release but she has no GI side effects.  Her last A1c 11/13/2023 was 6.2%.  She is doing an excellent job taking care of herself.  Last lipids 11/13/2023 are total 184, trig 65, HDL 80 and LDL 92.  The goal is LDL of 70 or less and her ASCVD risk exceeds 30%.  She is currently taking simvastatin  10 mg daily. She does take an 81 mg aspirin  daily.  She reports no heartburn or stomach pain.  She request a referral for a mammogram.  Her previous referral no one called her.        ROS    Objective:     BP 131/70 (BP Location: Left Arm, Patient Position: Sitting, Cuff Size: Normal)   Pulse (!) 53   Temp 97.9 F (36.6 C) (Oral)   Resp 18   Ht 5' 8 (1.727 m)   Wt 169 lb (76.7 kg)   LMP 02/07/1991   SpO2 96%   BMI 25.70 kg/m    Physical Exam Vitals and nursing note reviewed.  Constitutional:      Appearance: Normal appearance.  HENT:     Head: Normocephalic and atraumatic.  Eyes:     Conjunctiva/sclera: Conjunctivae normal.  Cardiovascular:     Rate and Rhythm: Normal rate and regular rhythm.  Pulmonary:     Effort: Pulmonary effort is normal.     Breath sounds: Normal breath sounds.  Musculoskeletal:     Right lower leg: No edema.     Left lower leg: No edema.  Skin:    General: Skin is warm and dry.  Neurological:     Mental Status: She is  alert and oriented to person, place, and time.     Cranial Nerves: Cranial nerve deficit present.  Psychiatric:        Mood and Affect: Mood normal.        Behavior: Behavior normal.        Thought Content: Thought content normal.        Judgment: Judgment normal.          No results found for any visits on 05/14/24.    The 10-year ASCVD risk score (Arnett DK, et al., 2019) is: 30.5%    Assessment & Plan:  Encounter for screening mammogram for malignant neoplasm of breast Assessment & Plan: She request to go to [facility].  Advise she can call the radiology department and schedule herself if she would like.   Mixed hyperlipidemia Assessment & Plan: 11/2023 total cholesterol 184, trig 65, HDL 80 and LDL 92.  Her ASCVD risk is greater than 30%.  She is taking simvastatin  10 mg daily.  Ask her to change that to atorvastatin  20 mg daily and she agreed.  Will recheck in 3 months.  Orders: -     Atorvastatin  Calcium ; Take 1 tablet (  20 mg total) by mouth daily.  Dispense: 90 tablet; Refill: 3  Controlled diabetes mellitus type 2 with complications (HCC) Assessment & Plan: She is on metformin  500 mg twice daily and her most recent A1c is 6.2%.  She is doing an excellent job.  Please continue taking 81 mg aspirin  daily.  Orders: -     metFORMIN  HCl; TAKE 1 TABLET(500 MG) BY MOUTH TWICE DAILY WITH A MEAL  Dispense: 180 tablet; Refill: 1     Return in about 3 months (around 08/14/2024).    Audreyanna Butkiewicz K Raniya Golembeski, MD

## 2024-05-15 NOTE — Assessment & Plan Note (Signed)
 She is on metformin  500 mg twice daily and her most recent A1c is 6.2%.  She is doing an excellent job.  Please continue taking 81 mg aspirin  daily.

## 2024-05-15 NOTE — Assessment & Plan Note (Signed)
 11/2023 total cholesterol 184, trig 65, HDL 80 and LDL 92.  Her ASCVD risk is greater than 30%.  She is taking simvastatin  10 mg daily.  Ask her to change that to atorvastatin  20 mg daily and she agreed.  Will recheck in 3 months.

## 2024-05-15 NOTE — Assessment & Plan Note (Signed)
 She request to go to [facility].  Advise she can call the radiology department and schedule herself if she would like.

## 2024-05-18 ENCOUNTER — Ambulatory Visit

## 2024-05-18 VITALS — BP 131/70 | Ht 63.5 in | Wt 168.0 lb

## 2024-05-18 DIAGNOSIS — Z0000 Encounter for general adult medical examination without abnormal findings: Secondary | ICD-10-CM | POA: Diagnosis not present

## 2024-05-18 NOTE — Patient Instructions (Signed)
 Wendy Tucker,  Thank you for taking the time for your Medicare Wellness Visit. I appreciate your continued commitment to your health goals. Please review the care plan we discussed, and feel free to reach out if I can assist you further.  Please note that Annual Wellness Visits do not include a physical exam. Some assessments may be limited, especially if the visit was conducted virtually. If needed, we may recommend an in-person follow-up with your provider.  Ongoing Care Seeing your primary care provider every 3 to 6 months helps us  monitor your health and provide consistent, personalized care.   Referrals If a referral was made during today's visit and you haven't received any updates within two weeks, please contact the referred provider directly to check on the status.  Recommended Screenings:  Health Maintenance  Topic Date Due   Hepatitis C Screening  Never done   Medicare Annual Wellness Visit  12/04/2023   Flu Shot  02/06/2024   COVID-19 Vaccine (7 - 2025-26 season) 03/08/2024   Breast Cancer Screening  05/14/2024   Hemoglobin A1C  05/15/2024   Complete foot exam   07/16/2024   Yearly kidney function blood test for diabetes  11/12/2024   Yearly kidney health urinalysis for diabetes  11/12/2024   Eye exam for diabetics  01/15/2025   DTaP/Tdap/Td vaccine (2 - Td or Tdap) 10/25/2026   Colon Cancer Screening  11/16/2026   Pneumococcal Vaccine for age over 92  Completed   DEXA scan (bone density measurement)  Completed   Zoster (Shingles) Vaccine  Completed   Meningitis B Vaccine  Aged Out       05/18/2024    1:22 PM  Advanced Directives  Does Patient Have a Medical Advance Directive? Yes  Type of Advance Directive Living will  Does patient want to make changes to medical advance directive? No - Patient declined    Vision: Annual vision screenings are recommended for early detection of glaucoma, cataracts, and diabetic retinopathy. These exams can also reveal signs of  chronic conditions such as diabetes and high blood pressure.  Dental: Annual dental screenings help detect early signs of oral cancer, gum disease, and other conditions linked to overall health, including heart disease and diabetes.  Please see the attached documents for additional preventive care recommendations.

## 2024-05-18 NOTE — Progress Notes (Signed)
 I connected with  Wendy Tucker on 05/18/24 by a audio enabled telemedicine application and verified that I am speaking with the correct person using two identifiers.  Patient Location: Home  Provider Location: Home Office  Persons Participating in Visit: Patient.  I discussed the limitations of evaluation and management by telemedicine. The patient expressed understanding and agreed to proceed.  Vital Signs: Because this visit was a virtual/telehealth visit, some criteria may be missing or patient reported. Any vitals not documented were not able to be obtained and vitals that have been documented are patient reported.   Because this visit was a virtual/telehealth visit,  certain criteria was not obtained, such a blood pressure, CBG if applicable, and timed get up and go. Any medications not marked as taking were not mentioned during the medication reconciliation part of the visit. Any vitals not documented were not able to be obtained due to this being a telehealth visit or patient was unable to self-report a recent blood pressure reading due to a lack of equipment at home via telehealth. Vitals that have been documented are verbally provided by the patient.   This visit was performed by a medical professional under my direct supervision. I was immediately available for consultation/collaboration. I have reviewed and agree with the Annual Wellness Visit documentation.  Subjective:   Wendy Tucker is a 73 y.o. female who presents for a Medicare Annual Wellness Visit.  Allergies (verified) Cephalexin, Cortisone, and Amoxicillin   History: Past Medical History:  Diagnosis Date   Diabetes mellitus without complication (HCC)    Eye infection, right    Hyperlipidemia    Vertigo    x1, over 20 yrs ago   Past Surgical History:  Procedure Laterality Date   BREAST BIOPSY Bilateral    neg- needle bx/clips   BREAST BIOPSY Right 03/18/2019   affirm bx of calcs medial, coil clip,  negative   COLONOSCOPY WITH PROPOFOL  N/A 11/28/2016   Procedure: COLONOSCOPY WITH PROPOFOL ;  Surgeon: Jinny Carmine, MD;  Location: [facility];  Service: Endoscopy;  Laterality: N/A;   COLONOSCOPY WITH PROPOFOL  N/A 11/15/2021   Procedure: COLONOSCOPY WITH BIOPSY;  Surgeon: Jinny Carmine, MD;  Location: [facility];  Service: Endoscopy;  Laterality: N/A;   ECTOPIC PREGNANCY SURGERY     fractured leg Right 2008   has metal rods   POLYPECTOMY  11/28/2016   Procedure: POLYPECTOMY;  Surgeon: Jinny Carmine, MD;  Location: [facility];  Service: Endoscopy;;   POLYPECTOMY N/A 11/15/2021   Procedure: POLYPECTOMY;  Surgeon: Jinny Carmine, MD;  Location: [facility];  Service: Endoscopy;  Laterality: N/A;   Family History  Problem Relation Age of Onset   Hypertension Mother    Stomach cancer Father    Breast cancer Sister 52   Bone cancer Sister    Social History   Occupational History   Not on file  Tobacco Use   Smoking status: Never    Passive exposure: Never   Smokeless tobacco: Never  Vaping Use   Vaping status: Never Used  Substance and Sexual Activity   Alcohol use: Yes    Comment: Occasionally, 2 glass wine/month   Drug use: No   Sexual activity: Yes    Birth control/protection: Post-menopausal   Tobacco Counseling Counseling given: Not Answered  SDOH Screenings   Food Insecurity: No Food Insecurity (05/18/2024)  Housing: Low Risk  (05/18/2024)  Transportation Needs: No Transportation Needs (05/18/2024)  Utilities: Not At Risk (05/18/2024)  Alcohol Screen: Low Risk  (  11/09/2023)  Depression (PHQ2-9): Low Risk  (05/18/2024)  Financial Resource Strain: Medium Risk (11/09/2023)  Physical Activity: Insufficiently Active (05/18/2024)  Social Connections: Moderately Isolated (05/18/2024)  Stress: No Stress Concern Present (05/18/2024)  Tobacco Use: Low Risk  (05/18/2024)  Health Literacy: Adequate Health Literacy (05/18/2024)   Depression Screen     05/18/2024    1:25 PM 11/13/2023    8:29 AM 09/03/2023    1:30 PM 07/17/2023    8:12 AM 01/10/2023    8:54 AM 12/04/2022    3:00 PM 07/11/2022    8:30 AM  PHQ 2/9 Scores  PHQ - 2 Score 0 0 0 0 0 0 0  PHQ- 9 Score 0 0    0  0  0      Data saved with a previous flowsheet row definition     Goals Addressed             This Visit's Progress    Patient Stated       To hit the lottery        Visit info / Clinical Intake: Medicare Wellness Visit Type:: Initial Annual Wellness Visit Persons participating in visit:: patient Medicare Wellness Visit Mode:: Telephone If telephone:: video declined Because this visit was a virtual/telehealth visit:: pt reported vitals If Telephone or Video please confirm:: I connected with the patient using audio enabled telemedicine application and verified that I am speaking with the correct person using two identifiers; I discussed the limitations of evaluation and management by telemedicine; The patient expressed understanding and agreed to proceed Patient Location:: home Provider Location:: home office Information given by:: patient Interpreter Needed?: No Pre-visit prep was completed: yes AWV questionnaire completed by patient prior to visit?: no Living arrangements:: (!) lives alone Patient's Overall Health Status Rating: excellent Typical amount of pain: none Does pain affect daily life?: no Are you currently prescribed opioids?: no  Dietary Habits and Nutritional Risks How many meals a day?: 3 Most meals are obtained by: preparing own meals; eating out In the last 2 weeks, have you had any of the following?: none Diabetic:: no  Functional Status Activities of Daily Living (to include ambulation/medication): Independent Ambulation: Independent Medication Administration: Independent Home Management: Independent Manage your own finances?: yes Primary transportation is: driving Concerns about vision?: no *vision screening is required for  WTM* Concerns about hearing?: no  Fall Screening Falls in the past year?: 0 Number of falls in past year: 0 Was there an injury with Fall?: 0 Fall Risk Category Calculator: 0 Patient Fall Risk Level: Low Fall Risk  Fall Risk Patient at Risk for Falls Due to: No Fall Risks Fall risk Follow up: Falls evaluation completed; Education provided; Falls prevention discussed  Home and Transportation Safety: All rugs have non-skid backing?: N/A, no rugs All stairs or steps have railings?: N/A, no stairs Grab bars in the bathtub or shower?: yes Have non-skid surface in bathtub or shower?: yes Good home lighting?: yes Regular seat belt use?: yes Hospital stays in the last year:: no  Cognitive Assessment Difficulty concentrating, remembering, or making decisions? : no Will 6CIT or Mini Cog be Completed: no 6CIT or Mini Cog Declined: patient alert, oriented, able to answer questions appropriately and recall recent events  Advance Directives (For Healthcare) Does Patient Have a Medical Advance Directive?: Yes Does patient want to make changes to medical advance directive?: No - Patient declined Type of Advance Directive: Living will Copy of Living Will in Chart?: No - copy requested  Reviewed/Updated  Reviewed/Updated: Reviewed All (Medical, Surgical, Family, Medications, Allergies, Care Teams, Patient Goals)        Objective:    Today's Vitals   05/18/24 1320  BP: 131/70  Weight: 168 lb (76.2 kg)  Height: 5' 3.5 (1.613 m)   Body mass index is 29.29 kg/m.  Current Medications (verified) Outpatient Encounter Medications as of 05/18/2024  Medication Sig   Accu-Chek Softclix Lancets lancets Check BS 1 time daily   aspirin  81 MG tablet Take 1 tablet (81 mg total) by mouth daily.   atorvastatin  (LIPITOR) 20 MG tablet Take 1 tablet (20 mg total) by mouth daily.   cetirizine (ZYRTEC) 10 MG tablet Take 10 mg by mouth daily.   cholecalciferol (VITAMIN D ) 1000 units tablet Take  1,000 Units by mouth daily.   glucose blood (ACCU-CHEK GUIDE) test strip TEST ONCE DAILY   metFORMIN  (GLUCOPHAGE ) 500 MG tablet TAKE 1 TABLET(500 MG) BY MOUTH TWICE DAILY WITH A MEAL   Multiple Minerals-Vitamins (NUTRA-SUPPORT BONE) CAPS Take 1 capsule by mouth daily.   vitamin B-12 (CYANOCOBALAMIN ) 500 MCG tablet Take 1 tablet (500 mcg total) by mouth daily.   EPINEPHrine  0.3 mg/0.3 mL IJ SOAJ injection Inject 0.3 mg into the muscle as needed for anaphylaxis. (Patient not taking: Reported on 05/18/2024)   No facility-administered encounter medications on file as of 05/18/2024.   Hearing/Vision screen Hearing Screening - Comments:: N/a Vision Screening - Comments:: Wears glasses  Immunizations and Health Maintenance Health Maintenance  Topic Date Due   Hepatitis C Screening  Never done   Medicare Annual Wellness (AWV)  12/04/2023   Influenza Vaccine  02/06/2024   COVID-19 Vaccine (7 - 2025-26 season) 03/08/2024   Mammogram  05/14/2024   HEMOGLOBIN A1C  05/15/2024   FOOT EXAM  07/16/2024   Diabetic kidney evaluation - eGFR measurement  11/12/2024   Diabetic kidney evaluation - Urine ACR  11/12/2024   OPHTHALMOLOGY EXAM  01/15/2025   DTaP/Tdap/Td (2 - Td or Tdap) 10/25/2026   Colonoscopy  11/16/2026   Pneumococcal Vaccine: 50+ Years  Completed   DEXA SCAN  Completed   Zoster Vaccines- Shingrix  Completed   Meningococcal B Vaccine  Aged Out        Assessment/Plan:  This is a routine wellness examination for Wendy Tucker.  Patient Care Team: Tucker, Wendy K, MD as PCP - General (Family Medicine)  I have personally reviewed and noted the following in the patient's chart:   Medical and social history Use of alcohol, tobacco or illicit drugs  Current medications and supplements including opioid prescriptions. Functional ability and status Nutritional status Physical activity Advanced directives List of other physicians Hospitalizations, surgeries, and ER visits in previous 12  months Vitals Screenings to include cognitive, depression, and falls Referrals and appointments  No orders of the defined types were placed in this encounter.  In addition, I have reviewed and discussed with patient certain preventive protocols, quality metrics, and best practice recommendations. A written personalized care plan for preventive services as well as general preventive health recommendations were provided to patient.   Wendy Tucker Right, [place]   05/18/2024   No follow-ups on file.  After Visit Summary: (MyChart) Due to this being a telephonic visit, the after visit summary with patients personalized plan was offered to patient via MyChart   Nurse Notes: Nothing to report

## 2024-05-19 ENCOUNTER — Ambulatory Visit: Admitting: Family Medicine

## 2024-06-11 ENCOUNTER — Ambulatory Visit: Admitting: Family Medicine

## 2024-06-11 ENCOUNTER — Encounter: Payer: Self-pay | Admitting: Family Medicine

## 2024-06-11 VITALS — BP 104/56 | HR 68 | Ht 63.5 in | Wt 168.0 lb

## 2024-06-11 DIAGNOSIS — L089 Local infection of the skin and subcutaneous tissue, unspecified: Secondary | ICD-10-CM | POA: Diagnosis not present

## 2024-06-11 MED ORDER — DOXYCYCLINE HYCLATE 100 MG PO CAPS: 100.0000 mg | ORAL_CAPSULE | Freq: Two times a day (BID) | ORAL | 0 refills | Status: DC

## 2024-06-13 DIAGNOSIS — L089 Local infection of the skin and subcutaneous tissue, unspecified: Secondary | ICD-10-CM | POA: Insufficient documentation

## 2024-06-13 NOTE — Progress Notes (Signed)
   Established Patient Office Visit  Subjective   Patient ID: Wendy Tucker, female    DOB: 07/31/1950  Age: 73 y.o. 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  She push mows her front yard once a week for exercise.  She has got a problem with her right middle finger that started about 3 months ago.  She has a throbbing sensation around the cuticle.  The cuticle is swollen and erythematous.  She has been soaking her finger in peroxide and Epsom salts which seems to have helped.  Her nail is avulsing at the proximal edge in the area where the erythema and pain is.  She does not recall an injury or a bug bite.  She has never had anything like this before.      Objective:     BP (!) 104/56 (BP Location: Left Arm, Patient Position: Sitting, Cuff Size: Normal)   Pulse 68   Ht 5' 3.5 (1.613 m)   Wt 168 lb (76.2 kg)   LMP 02/07/1991   SpO2 96%   BMI 29.29 kg/m    Physical Exam Vitals reviewed.  Constitutional:      Appearance: Normal appearance.  HENT:     Head: Normocephalic.  Eyes:     General:        Right eye: No discharge.        Left eye: No discharge.  Cardiovascular:     Rate and Rhythm: Normal rate.  Pulmonary:     Effort: Pulmonary effort is normal.  Musculoskeletal:     Comments: Middle finger right hand avulsion of the nail at the proximal nail base centrally located in the nail.  Surrounding cuticle is erythematous and swollen.   Neurological:     Mental Status: She is alert and oriented to person, place, and time.   Psychiatric:        Mood and Affect: Mood normal.        Behavior: Behavior normal.        Thought Content: Thought content normal.        Judgment: Judgment normal.          No results found for any visits on 06/11/24.    The 10-year ASCVD risk score (Arnett DK, et al., 2019) is: 21.4%    Assessment & Plan:  Finger infection Assessment & Plan: She is allergic to penicillin and cephalosporins.  Will trial doxycycline  100 mg twice daily for 7 days.  Given the chronicity of this injury it may be fungal.  If it does not respond to antibiotics will try a course of fungal medication next.  Follow-up in a week.  Orders: -     Doxycycline  Hyclate; Take 1 capsule (100 mg total) by mouth 2 (two) times daily.  Dispense: 14 capsule; Refill: 0     Return in about 1 week (around 06/18/2024).    Jameriah Trotti K Marley Pakula, MD

## 2024-06-13 NOTE — Assessment & Plan Note (Signed)
 She is allergic to penicillin and cephalosporins.  Will trial doxycycline  100 mg twice daily for 7 days.  Given the chronicity of this injury it may be fungal.  If it does not respond to antibiotics will try a course of fungal medication next.  Follow-up in a week.

## 2024-06-18 ENCOUNTER — Ambulatory Visit: Admitting: Family Medicine

## 2024-06-18 ENCOUNTER — Ambulatory Visit: Payer: Self-pay

## 2024-06-18 NOTE — Telephone Encounter (Signed)
 FYI Only or Action Required?: FYI only for provider: appointment scheduled on 06/22/24, advised UC for worsening symptoms.  Patient was last seen in primary care on 06/11/2024 by Ziglar, Susan K, MD.  Called Nurse Triage reporting Pain.  Symptoms began a week ago.  Interventions attempted: Prescription medications: Doxycycline .  Symptoms are: stable.  Triage Disposition: Home Care  Patient/caregiver understands and will follow disposition?: Yes  Reason for Disposition  Finger pain  Answer Assessment - Initial Assessment Questions Patient reports doxycycline  100 mg PO BID is not working. Patient was told she needs to see a hand doctor by a doctor at the clinic at work. Patient cancelled appointment 12/8 and rescheduled for 12/16. Advised patient she can discuss a referral to a hand specialist with PCP, if symptoms are worsening go to UC.  1. ONSET: When did the pain start?      Over a week  2. LOCATION and RADIATION: Where is the pain located?  (e.g., fingertip, around nail, joint, entire      Right middle finger  3. SEVERITY: How bad is the pain? What does it keep you from doing?   (Scale 1-10; or mild, moderate, severe)     Not bad  4. CAUSE: What do you think is causing the pain?     Infection  5. OTHER SYMPTOMS: Do you have any other symptoms? (e.g., fever, neck pain, numbness)     Swelling, throbbing  Protocols used: Finger Pain-A-AH

## 2024-06-18 NOTE — Telephone Encounter (Signed)
 1st attempt unsuccessful, left voicemail requesting call back.  Note: pt prescribed doxycycline  100 mg PO BID x 7 days on 06/11/24 and advised to return in one week for f/u. Dx infection vs fungal. Pt had an appt scheduled today that is marked cancelled.  Copied from CRM #[id number]. Topic: Clinical - Red Word Triage >> Jun 18, 2024 10:52 AM Dedra B wrote: Red Word that prompted transfer to Nurse Triage: Pt was treated in office last week for infected R middle finger. She was prescribed antibiotics that aren't helping. Pt's finger is still swollen and throbbing like a toothache. Warm transfer to NT. >> Jun 18, 2024 11:08 AM Dedra B wrote: Pt hung up while holding for NT. Attempted to call pt back and no answer.

## 2024-06-21 ENCOUNTER — Inpatient Hospital Stay: Admission: RE | Admit: 2024-06-21 | Discharge: 2024-06-21 | Attending: Family Medicine | Admitting: Family Medicine

## 2024-06-21 DIAGNOSIS — Z1231 Encounter for screening mammogram for malignant neoplasm of breast: Secondary | ICD-10-CM

## 2024-06-22 ENCOUNTER — Ambulatory Visit: Admitting: Family Medicine

## 2024-06-22 ENCOUNTER — Encounter: Payer: Self-pay | Admitting: Family Medicine

## 2024-06-22 VITALS — BP 137/68 | HR 62 | Temp 98.5°F | Ht 63.5 in | Wt 171.5 lb

## 2024-06-22 DIAGNOSIS — L03011 Cellulitis of right finger: Secondary | ICD-10-CM

## 2024-06-22 DIAGNOSIS — L03019 Cellulitis of unspecified finger: Secondary | ICD-10-CM | POA: Insufficient documentation

## 2024-06-22 DIAGNOSIS — L089 Local infection of the skin and subcutaneous tissue, unspecified: Secondary | ICD-10-CM

## 2024-06-22 MED ORDER — SULFAMETHOXAZOLE-TRIMETHOPRIM 800-160 MG PO TABS: 1.0000 | ORAL_TABLET | Freq: Two times a day (BID) | ORAL | 0 refills | Status: DC

## 2024-06-22 NOTE — Progress Notes (Signed)
° °  Established Patient Office Visit  Subjective   Patient ID: Wendy Tucker, female    DOB: 03-Mar-1951  Age: 73 y.o. 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  She push mows her yard as well.  She had an injury to her right middle finger when started about 3 weeks ago and has been throbbing sensation around the cuticle the distal finger is swollen and erythematous.  She has been soaking her finger in peroxide and Epsom salts which seem to have helped the nailbed is avulsing at the proximal edge.  She does not recall an injury or a bug bite.  She has never had anything like this before.  She is allergic to penicillin.  She took doxycycline  100 mg twice daily for 10 days and it had no effect.  Her finger is still throbbing erythematous and edematous.    Objective:     BP 137/68   Pulse 62   Temp 98.5 F (36.9 C) (Oral)   Ht 5' 3.5 (1.613 m)   Wt 171 lb 8 oz (77.8 kg)   LMP 02/07/1991   SpO2 95%   BMI 29.90 kg/m    Physical Exam Vitals reviewed.  Constitutional:      Appearance: Normal appearance.  HENT:     Head: Normocephalic.  Eyes:     General:        Right eye: No discharge.        Left eye: No discharge.  Cardiovascular:     Rate and Rhythm: Normal rate.  Pulmonary:     Effort: Pulmonary effort is normal.  Musculoskeletal:     Comments: Third finger right hand distally erythematous and edematous nail is avulsed proximally. Tender to palpation  Neurological:     Mental Status: She is alert and oriented to person, place, and time.  Psychiatric:        Mood and Affect: Mood normal.        Behavior: Behavior normal.        Thought Content: Thought content normal.        Judgment: Judgment normal.          No results found for any visits on  06/22/24.    The 10-year ASCVD risk score (Arnett DK, et al., 2019) is: 32.6%    Assessment & Plan:  Finger infection Assessment & Plan: Will try Bactrim  double strength twice daily for a week.  If no improvement may refer to hand.  Orders: -     Sulfamethoxazole -Trimethoprim ; Take 1 tablet by mouth 2 (two) times daily.  Dispense: 14 tablet; Refill: 0  Cellulitis of finger of right hand     No follow-ups on file.    Lonna Rabold K Nekhi Liwanag, MD

## 2024-06-22 NOTE — Assessment & Plan Note (Signed)
 Will try Bactrim  double strength twice daily for a week.  If no improvement may refer to hand.

## 2024-06-29 ENCOUNTER — Ambulatory Visit: Admitting: Family Medicine

## 2024-06-29 ENCOUNTER — Ambulatory Visit
Admission: RE | Admit: 2024-06-29 | Discharge: 2024-06-29 | Disposition: A | Discharge status: Home / Self Care | Attending: Family Medicine | Admitting: Family Medicine

## 2024-06-29 ENCOUNTER — Ambulatory Visit
Admission: RE | Admit: 2024-06-29 | Discharge: 2024-06-29 | Disposition: A | Discharge status: Home / Self Care | Source: Ambulatory Visit | Attending: Family Medicine | Admitting: Family Medicine

## 2024-06-29 ENCOUNTER — Encounter: Payer: Self-pay | Admitting: Family Medicine

## 2024-06-29 VITALS — BP 124/69 | HR 74 | Resp 16 | Ht 63.05 in | Wt 169.0 lb

## 2024-06-29 DIAGNOSIS — L03011 Cellulitis of right finger: Secondary | ICD-10-CM | POA: Diagnosis not present

## 2024-06-29 DIAGNOSIS — L089 Local infection of the skin and subcutaneous tissue, unspecified: Secondary | ICD-10-CM

## 2024-06-29 DIAGNOSIS — M7989 Other specified soft tissue disorders: Secondary | ICD-10-CM

## 2024-06-29 MED ORDER — SULFAMETHOXAZOLE-TRIMETHOPRIM 800-160 MG PO TABS: 1.0000 | ORAL_TABLET | Freq: Two times a day (BID) | ORAL | 0 refills | Status: AC

## 2024-06-29 NOTE — Assessment & Plan Note (Signed)
 Looks somewhat better after taking Bactrim  for a week.  Refilled Bactrim  for another week and ask her to get an x-ray of the finger.  May have to refer her to a hand surgeon.

## 2024-06-29 NOTE — Progress Notes (Signed)
" ° °  Established Patient Office Visit  Subjective   Patient ID: Wendy Tucker, female    DOB: 1951/05/19  Age: 73 y.o. 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  She had an injury to her middle finger that started about 4 weeks ago.  She has a throbbing sensation around the cuticle and distal finger is swollen and erythematous.  She has been on Bactrim  double strength twice daily for the last week.  Her finger may be a little bit better today but it still throbs.             Objective:     BP 124/69   Pulse 74   Resp 16   Ht 5' 3.05 (1.601 m)   Wt 169 lb (76.7 kg)   LMP 02/07/1991   SpO2 97%   BMI 29.89 kg/m    Physical Exam Vitals and nursing note reviewed.  Constitutional:      Appearance: Normal appearance.  HENT:     Head: Normocephalic and atraumatic.  Eyes:     Conjunctiva/sclera: Conjunctivae normal.  Cardiovascular:     Rate and Rhythm: Normal rate and regular rhythm.  Pulmonary:     Effort: Pulmonary effort is normal.     Breath sounds: Normal breath sounds.  Musculoskeletal:     Right lower leg: No edema.     Left lower leg: No edema.     Comments: Swelling and erythema of the distal phalanx and around the cuticle base of nail has white discharge.  Skin:    General: Skin is warm and dry.  Neurological:     Mental Status: She is alert and oriented to person, place, and time.  Psychiatric:        Mood and Affect: Mood normal.        Behavior: Behavior normal.        Thought Content: Thought content normal.        Judgment: Judgment normal.          No results found for any visits on 06/29/24.    The 10-year ASCVD risk score (Arnett DK, et al., 2019) is: 28.1%    Assessment &  Plan:  Swollen finger -     DG Finger Middle Right; Future  Finger infection -     Sulfamethoxazole -Trimethoprim ; Take 1 tablet by mouth 2 (two) times daily.  Dispense: 14 tablet; Refill: 0     Return in about 1 week (around 07/06/2024).    Eual Lindstrom K Adalina Dopson, MD "

## 2024-07-05 ENCOUNTER — Ambulatory Visit: Payer: Self-pay

## 2024-07-05 NOTE — Telephone Encounter (Signed)
 FYI Only or Action Required?: Action required by provider: referral request and update on patient condition.  Patient was last seen in primary care on 06/29/2024 by Ziglar, Susan K, MD.  Called Nurse Triage reporting Hand Pain.  Symptoms began about a month ago.  Interventions attempted: Prescription medications: Bactrim .  Symptoms are: unchanged.  Triage Disposition: Call PCP Now (overriding See HCP Within 4 Hours (Or PCP Triage))  Patient/caregiver understands and will follow disposition?: Yes             Copied from CRM #[id number]. Topic: Clinical - Red Word Triage >> Jul 05, 2024  3:07 PM Avram MATSU wrote: Red Word that prompted transfer to Nurse Triage: throbbing/infected/pain off and on on her finger. Pt had a xray and would like to know the reading. Stated its not getting better.    ----------------------------------------------------------------------- From previous Reason for Contact - Scheduling: Patient/patient representative is calling to schedule an appointment. Refer to attachments for appointment information. Reason for Disposition  [1] Looks infected (spreading redness, red streak, pus) AND [2] large red area (> 2 inches or 5 cm, or entire finger)  Answer Assessment - Initial Assessment Questions 1. ONSET: When did the pain start?      About a month ago.  2. LOCATION and RADIATION: Where is the pain located?  (e.g., fingertip, around nail, joint, entire      Right hand middle finger.  3. SEVERITY: How bad is the pain? What does it keep you from doing?   (Scale 1-10; or mild, moderate, severe)     5/10 at this time and worse at night. Describes it like a tooth ache.  4. APPEARANCE: What does the finger look like? (e.g., redness, swelling, bruising, pallor)     Redness and swelling from fingernail to middle joint of finger.  5. WORK OR EXERCISE: Has there been any recent work or exercise that involved this part (i.e., fingers or hand) of the  body?     No.  6. CAUSE: What do you think is causing the pain?     Infection; seen twice in office for this.  7. AGGRAVATING FACTORS: What makes the pain worse? (e.g., using computer)     N/A.  8. OTHER SYMPTOMS: Do you have any other symptoms? (e.g., fever, neck pain, numbness)     No fever, black or purple discoloration. Patient states she is still able to use her right hand.  Protocols used: Finger Pain-A-AH

## 2024-07-13 ENCOUNTER — Ambulatory Visit: Admitting: Family Medicine

## 2024-07-13 ENCOUNTER — Encounter: Payer: Self-pay | Admitting: Family Medicine

## 2024-07-13 VITALS — BP 130/77 | HR 65 | Temp 98.1°F | Ht 63.0 in | Wt 171.4 lb

## 2024-07-13 DIAGNOSIS — L03011 Cellulitis of right finger: Secondary | ICD-10-CM

## 2024-07-13 DIAGNOSIS — R92333 Mammographic heterogeneous density, bilateral breasts: Secondary | ICD-10-CM | POA: Diagnosis not present

## 2024-07-13 DIAGNOSIS — L089 Local infection of the skin and subcutaneous tissue, unspecified: Secondary | ICD-10-CM

## 2024-07-13 MED ORDER — FLUCONAZOLE 100 MG PO TABS: 100.0000 mg | ORAL_TABLET | Freq: Every day | ORAL | 0 refills | Status: AC

## 2024-07-13 MED ORDER — CLOTRIMAZOLE 1 % EX CREA: 1.0000 | TOPICAL_CREAM | Freq: Two times a day (BID) | CUTANEOUS | 0 refills | Status: AC

## 2024-07-13 NOTE — Progress Notes (Signed)
" ° °  Established Patient Office Visit  Subjective   Patient ID: Wendy Tucker, female    DOB: [birth year]  Age: 74 y.o. MRN: [id number]  Chief Complaint  Patient presents with   finger infection    Pt is here today about the finger infection. States she feels like things are the same. Has throbbing pain. Did get an xray performed. States she needs to be referred to specialist.    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   She reports she has throbbing pain in her middle finger when she goes to bed at night.  No trauma or injury to the finger that she is aware of.  She did get her x-rays done and they showed swelling of the finger only.  She has finished a round of doxycycline  followed by 10 days of Bactrim  with no improvement of her finger.               Objective:     BP 130/77 (BP Location: Left Arm, Patient Position: Sitting, Cuff Size: Normal)   Pulse 65   Temp 98.1 F (36.7 C) (Oral)   Ht 5' 3 (1.6 m)   Wt 171 lb 6.4 oz (77.7 kg)   LMP 02/07/1991   SpO2 96%   BMI 30.36 kg/m    Physical Exam Vitals reviewed.  Constitutional:      Appearance: Normal appearance.  HENT:     Head: Normocephalic.  Eyes:     General:        Right eye: No discharge.        Left eye: No discharge.  Cardiovascular:     Rate and Rhythm: Normal rate.  Pulmonary:     Effort: Pulmonary effort is normal.  Skin:    Comments: Erythematous and edematous third finger with nail destruction proximally.  Neurological:     Mental Status: She is alert and oriented to person, place, and time.  Psychiatric:        Mood and Affect: Mood normal.        Behavior: Behavior normal.        Thought Content: Thought content normal.        Judgment: Judgment normal.          No results found for any visits on 07/13/24.    The  10-year ASCVD risk score (Arnett DK, et al., 2019) is: 32.7%    Assessment & Plan:  Heterogeneously dense tissue of both breasts on mammography -     US  BREAST COMPLETE UNI LEFT INC AXILLA; Future -     US  BREAST COMPLETE UNI RIGHT INC AXILLA; Future  Finger infection -     Ambulatory referral to Hand Surgery -     Clotrimazole ; Apply 1 Application topically 2 (two) times daily.  Dispense: 30 g; Refill: 0 -     Fluconazole ; Take 1 tablet (100 mg total) by mouth daily.  Dispense: 7 tablet; Refill: 0  Cellulitis of finger of right hand Assessment & Plan: Cellulitis did not respond to doxycycline  or Bactrim  double strength.  Concerned that it may be fungal.  Will try Diflucan  100 mg daily and topical clotrimazole  twice a day.  Referral to hand specialist.      Return in about 4 weeks (around 08/10/2024).    Sidni Fusco K Trigg Delarocha, MD "

## 2024-07-22 ENCOUNTER — Telehealth: Payer: Self-pay | Admitting: Family Medicine

## 2024-07-22 NOTE — Telephone Encounter (Unsigned)
 Copied from CRM [id number]. Topic: Referral - Question >> Jul 22, 2024 11:22 AM Tiffini S wrote: Reason for CRM: Patient states that the referral was sent to an office in [place]- needs to be sent to a location in [place] or [place] for her hand  Patient asked for a call back to discuss at [phone]

## 2024-07-23 ENCOUNTER — Other Ambulatory Visit: Payer: Self-pay | Admitting: Family Medicine

## 2024-07-23 ENCOUNTER — Telehealth: Payer: Self-pay | Admitting: Family Medicine

## 2024-07-23 DIAGNOSIS — L089 Local infection of the skin and subcutaneous tissue, unspecified: Secondary | ICD-10-CM

## 2024-07-23 NOTE — Telephone Encounter (Signed)
 Called patient and advised that I changed her referral to a Hydrographic Surveyor in [place]

## 2024-07-25 NOTE — Assessment & Plan Note (Signed)
 Cellulitis did not respond to doxycycline  or Bactrim  double strength.  Concerned that it may be fungal.  Will try Diflucan  100 mg daily and topical clotrimazole  twice a day.  Referral to hand specialist.

## 2024-07-26 ENCOUNTER — Other Ambulatory Visit: Payer: Self-pay | Admitting: Family Medicine

## 2024-07-26 DIAGNOSIS — L089 Local infection of the skin and subcutaneous tissue, unspecified: Secondary | ICD-10-CM

## 2024-07-26 NOTE — Telephone Encounter (Signed)
 Referral was placed. Routing to [place] for assistance with the referral since pt provided a preference.

## 2024-07-26 NOTE — Telephone Encounter (Signed)
 Copied from CRM [id number]. Topic: Referral - Status >> Jul 26, 2024  9:55 AM Viola F wrote: Reason for CRM: Patient called to follow up on the referral to Hand Surgery - she would like the referral sent to Alexia H. Francisco, MD in [place] or Kayla CHARLENA Pinal, MD in [place]. Please let her know when referral is in.

## 2024-08-02 ENCOUNTER — Other Ambulatory Visit: Payer: Self-pay

## 2024-08-02 ENCOUNTER — Telehealth: Payer: Self-pay | Admitting: Family Medicine

## 2024-08-02 DIAGNOSIS — E782 Mixed hyperlipidemia: Secondary | ICD-10-CM

## 2024-08-02 DIAGNOSIS — E118 Type 2 diabetes mellitus with unspecified complications: Secondary | ICD-10-CM

## 2024-08-02 MED ORDER — METFORMIN HCL 500 MG PO TABS: ORAL_TABLET | ORAL | 1 refills | Status: AC

## 2024-08-02 MED ORDER — ATORVASTATIN CALCIUM 20 MG PO TABS: 20.0000 mg | ORAL_TABLET | Freq: Every day | ORAL | 3 refills | Status: AC

## 2024-08-02 NOTE — Telephone Encounter (Signed)
 Copied from CRM #[id number]. Topic: Clinical - Medication Question >> Aug 02, 2024 12:27 PM Larissa RAMAN wrote: Reason for CRM: Patient requesting a callback from PCP regarding medication concerns for atorvastatin  (LIPITOR) 20 M and metFORMIN  (GLUCOPHAGE ) 500 MG tablet
